# Patient Record
Sex: Male | Born: 1975 | Race: White | Hispanic: No | Marital: Married | Smoking: Never smoker
Health system: Southern US, Community
[De-identification: ages and names within clinical notes are randomized; demographics above are authoritative.]

## PROBLEM LIST (undated history)

## (undated) DIAGNOSIS — N200 Calculus of kidney: Secondary | ICD-10-CM

## (undated) DIAGNOSIS — K760 Fatty (change of) liver, not elsewhere classified: Secondary | ICD-10-CM

## (undated) HISTORY — PX: VARICOSE VEIN SURGERY: SHX832

## (undated) HISTORY — PX: CHOLECYSTECTOMY: SHX55

## (undated) HISTORY — PX: WISDOM TOOTH EXTRACTION: SHX21

## (undated) HISTORY — PX: OTHER SURGICAL HISTORY: SHX169

---

## 2004-04-30 ENCOUNTER — Ambulatory Visit (HOSPITAL_BASED_OUTPATIENT_CLINIC_OR_DEPARTMENT_OTHER): Admission: RE | Admit: 2004-04-30 | Discharge: 2004-04-30 | Payer: Self-pay | Attending: Urology | Admitting: Urology

## 2017-09-27 ENCOUNTER — Other Ambulatory Visit: Payer: Self-pay | Admitting: Urology

## 2017-09-27 ENCOUNTER — Encounter (HOSPITAL_COMMUNITY): Payer: Self-pay

## 2017-09-27 ENCOUNTER — Observation Stay (HOSPITAL_COMMUNITY)
Admission: AD | Admit: 2017-09-27 | Discharge: 2017-09-28 | Disposition: A | Discharge status: Home / Self Care | Payer: PRIVATE HEALTH INSURANCE | Source: Ambulatory Visit | Attending: Urology | Admitting: Urology

## 2017-09-27 ENCOUNTER — Other Ambulatory Visit: Payer: Self-pay

## 2017-09-27 ENCOUNTER — Encounter (HOSPITAL_COMMUNITY)
Admission: AD | Disposition: A | Discharge status: Home / Self Care | Payer: Self-pay | Source: Ambulatory Visit | Attending: Urology

## 2017-09-27 ENCOUNTER — Observation Stay (HOSPITAL_COMMUNITY): Payer: PRIVATE HEALTH INSURANCE

## 2017-09-27 ENCOUNTER — Observation Stay (HOSPITAL_COMMUNITY): Payer: PRIVATE HEALTH INSURANCE | Admitting: Certified Registered Nurse Anesthetist

## 2017-09-27 DIAGNOSIS — Z538 Procedure and treatment not carried out for other reasons: Secondary | ICD-10-CM | POA: Diagnosis not present

## 2017-09-27 DIAGNOSIS — N132 Hydronephrosis with renal and ureteral calculous obstruction: Secondary | ICD-10-CM | POA: Diagnosis not present

## 2017-09-27 DIAGNOSIS — N201 Calculus of ureter: Secondary | ICD-10-CM | POA: Diagnosis present

## 2017-09-27 HISTORY — PX: CYSTOSCOPY/RETROGRADE/URETEROSCOPY: SHX5316

## 2017-09-27 LAB — CBC WITH DIFFERENTIAL/PLATELET
BASOS PCT: 0 %
Basophils Absolute: 0 10*3/uL (ref 0.0–0.1)
EOS ABS: 0 10*3/uL (ref 0.0–0.7)
Eosinophils Relative: 0 %
HCT: 37 % — ABNORMAL LOW (ref 39.0–52.0)
Hemoglobin: 12.3 g/dL — ABNORMAL LOW (ref 13.0–17.0)
Lymphocytes Relative: 17 %
Lymphs Abs: 1.2 10*3/uL (ref 0.7–4.0)
MCH: 30.4 pg (ref 26.0–34.0)
MCHC: 33.2 g/dL (ref 30.0–36.0)
MCV: 91.6 fL (ref 78.0–100.0)
Monocytes Absolute: 0.8 10*3/uL (ref 0.1–1.0)
Monocytes Relative: 11 %
NEUTROS PCT: 72 %
Neutro Abs: 5.2 10*3/uL (ref 1.7–7.7)
PLATELETS: 254 10*3/uL (ref 150–400)
RBC: 4.04 MIL/uL — AB (ref 4.22–5.81)
RDW: 13.3 % (ref 11.5–15.5)
WBC: 7.3 10*3/uL (ref 4.0–10.5)

## 2017-09-27 LAB — BASIC METABOLIC PANEL
Anion gap: 10 (ref 5–15)
BUN: 21 mg/dL — AB (ref 6–20)
CALCIUM: 8.8 mg/dL — AB (ref 8.9–10.3)
CO2: 28 mmol/L (ref 22–32)
CREATININE: 1.86 mg/dL — AB (ref 0.61–1.24)
Chloride: 105 mmol/L (ref 98–111)
GFR, EST AFRICAN AMERICAN: 51 mL/min — AB (ref >60)
GFR, EST NON AFRICAN AMERICAN: 44 mL/min — AB (ref >60)
Glucose, Bld: 115 mg/dL — ABNORMAL HIGH (ref 70–99)
Potassium: 3.9 mmol/L (ref 3.5–5.1)
SODIUM: 143 mmol/L (ref 135–145)

## 2017-09-27 SURGERY — CYSTOSCOPY/RETROGRADE/URETEROSCOPY
Anesthesia: General | Laterality: Left

## 2017-09-27 MED ORDER — CIPROFLOXACIN IN D5W 400 MG/200ML IV SOLN
INTRAVENOUS | Status: AC
  Filled 2017-09-27: qty 200

## 2017-09-27 MED ORDER — ENOXAPARIN SODIUM 40 MG/0.4ML ~~LOC~~ SOLN: 40.0000 mg | Freq: Every day | SUBCUTANEOUS | Status: DC

## 2017-09-27 MED ORDER — PIPERACILLIN-TAZOBACTAM 3.375 G IVPB
3.3750 g | Freq: Three times a day (TID) | INTRAVENOUS | Status: DC
  Administered 2017-09-27 – 2017-09-28 (×2): 3.375 g via INTRAVENOUS
  Filled 2017-09-27 (×2): qty 50

## 2017-09-27 MED ORDER — DEXAMETHASONE SODIUM PHOSPHATE 10 MG/ML IJ SOLN
INTRAMUSCULAR | Status: AC
  Filled 2017-09-27: qty 1

## 2017-09-27 MED ORDER — HYDROMORPHONE HCL 1 MG/ML IJ SOLN: 0.5000 mg | INTRAMUSCULAR | Status: DC | PRN

## 2017-09-27 MED ORDER — CIPROFLOXACIN IN D5W 400 MG/200ML IV SOLN
400.0000 mg | Freq: Two times a day (BID) | INTRAVENOUS | Status: DC
  Administered 2017-09-27: 400 mg via INTRAVENOUS

## 2017-09-27 MED ORDER — SODIUM CHLORIDE 0.9 % IR SOLN
Status: DC | PRN
  Administered 2017-09-27: 3000 mL

## 2017-09-27 MED ORDER — MIDAZOLAM HCL 5 MG/5ML IJ SOLN
INTRAMUSCULAR | Status: DC | PRN
  Administered 2017-09-27: 2 mg via INTRAVENOUS

## 2017-09-27 MED ORDER — OXYCODONE-ACETAMINOPHEN 5-325 MG PO TABS
1.0000 | ORAL_TABLET | Freq: Four times a day (QID) | ORAL | Status: DC | PRN
  Administered 2017-09-27 – 2017-09-28 (×2): 1 via ORAL
  Filled 2017-09-27 (×2): qty 1

## 2017-09-27 MED ORDER — OXYBUTYNIN CHLORIDE 5 MG PO TABS
5.0000 mg | ORAL_TABLET | Freq: Three times a day (TID) | ORAL | Status: DC | PRN
Start: 2017-09-27 — End: 2017-09-28
  Administered 2017-09-27 – 2017-09-28 (×2): 5 mg via ORAL
  Filled 2017-09-27 (×2): qty 1

## 2017-09-27 MED ORDER — OXYCODONE HCL 5 MG PO TABS: 5.0000 mg | ORAL_TABLET | ORAL | Status: DC | PRN

## 2017-09-27 MED ORDER — MIDAZOLAM HCL 2 MG/2ML IJ SOLN
INTRAMUSCULAR | Status: AC
  Filled 2017-09-27: qty 2

## 2017-09-27 MED ORDER — DEXAMETHASONE SODIUM PHOSPHATE 10 MG/ML IJ SOLN
INTRAMUSCULAR | Status: DC | PRN
  Administered 2017-09-27: 10 mg via INTRAVENOUS

## 2017-09-27 MED ORDER — DEXTROSE-NACL 5-0.45 % IV SOLN
INTRAVENOUS | Status: DC
  Administered 2017-09-27: 11:00:00 via INTRAVENOUS

## 2017-09-27 MED ORDER — OXYCODONE HCL 5 MG/5ML PO SOLN
5.0000 mg | Freq: Once | ORAL | Status: AC | PRN
  Filled 2017-09-27: qty 5

## 2017-09-27 MED ORDER — HYDROMORPHONE HCL 1 MG/ML IJ SOLN
INTRAMUSCULAR | Status: AC
  Administered 2017-09-27: 0.5 mg via INTRAVENOUS
  Filled 2017-09-27: qty 1

## 2017-09-27 MED ORDER — ONDANSETRON HCL 4 MG/2ML IJ SOLN
INTRAMUSCULAR | Status: DC | PRN
  Administered 2017-09-27: 4 mg via INTRAVENOUS

## 2017-09-27 MED ORDER — HYDROMORPHONE HCL 1 MG/ML IJ SOLN
0.2500 mg | INTRAMUSCULAR | Status: DC | PRN
  Administered 2017-09-27 (×4): 0.5 mg via INTRAVENOUS

## 2017-09-27 MED ORDER — ACETAMINOPHEN 325 MG PO TABS
650.0000 mg | ORAL_TABLET | ORAL | Status: DC | PRN
  Administered 2017-09-27: 650 mg via ORAL
  Filled 2017-09-27: qty 2

## 2017-09-27 MED ORDER — LIDOCAINE 2% (20 MG/ML) 5 ML SYRINGE
INTRAMUSCULAR | Status: DC | PRN
  Administered 2017-09-27: 100 mg via INTRAVENOUS

## 2017-09-27 MED ORDER — LACTATED RINGERS IV SOLN
INTRAVENOUS | Status: DC
  Administered 2017-09-27: 16:00:00 via INTRAVENOUS

## 2017-09-27 MED ORDER — IOHEXOL 300 MG/ML  SOLN
INTRAMUSCULAR | Status: DC | PRN
  Administered 2017-09-27: 6 mL via URETHRAL

## 2017-09-27 MED ORDER — FENTANYL CITRATE (PF) 100 MCG/2ML IJ SOLN
INTRAMUSCULAR | Status: DC | PRN
  Administered 2017-09-27: 25 ug via INTRAVENOUS
  Administered 2017-09-27: 50 ug via INTRAVENOUS
  Administered 2017-09-27: 25 ug via INTRAVENOUS

## 2017-09-27 MED ORDER — FENTANYL CITRATE (PF) 100 MCG/2ML IJ SOLN
INTRAMUSCULAR | Status: AC
  Filled 2017-09-27: qty 2

## 2017-09-27 MED ORDER — ONDANSETRON HCL 4 MG/2ML IJ SOLN: 4.0000 mg | INTRAMUSCULAR | Status: DC | PRN

## 2017-09-27 MED ORDER — OXYCODONE HCL 5 MG PO TABS
ORAL_TABLET | ORAL | Status: AC
  Administered 2017-09-27: 5 mg via ORAL
  Filled 2017-09-27: qty 1

## 2017-09-27 MED ORDER — ONDANSETRON HCL 4 MG/2ML IJ SOLN
INTRAMUSCULAR | Status: AC
  Filled 2017-09-27: qty 2

## 2017-09-27 MED ORDER — PROPOFOL 10 MG/ML IV BOLUS
INTRAVENOUS | Status: DC | PRN
  Administered 2017-09-27: 200 mg via INTRAVENOUS

## 2017-09-27 MED ORDER — OXYCODONE HCL 5 MG PO TABS
5.0000 mg | ORAL_TABLET | Freq: Once | ORAL | Status: AC | PRN
  Administered 2017-09-27: 5 mg via ORAL

## 2017-09-27 MED ORDER — PROPOFOL 10 MG/ML IV BOLUS
INTRAVENOUS | Status: AC
  Filled 2017-09-27: qty 40

## 2017-09-27 MED ORDER — PROMETHAZINE HCL 25 MG/ML IJ SOLN: 6.2500 mg | INTRAMUSCULAR | Status: DC | PRN

## 2017-09-27 SURGICAL SUPPLY — 26 items
BAG URO CATCHER STRL LF (MISCELLANEOUS) ×3 IMPLANT
BASKET LASER NITINOL 1.9FR (BASKET) ×1 IMPLANT
BASKET ZERO TIP NITINOL 2.4FR (BASKET) ×2 IMPLANT
BSKT STON RTRVL 120 1.9FR (BASKET)
BSKT STON RTRVL ZERO TP 2.4FR (BASKET) ×1
CATH INTERMIT  6FR 70CM (CATHETERS) ×2 IMPLANT
CLOTH BEACON ORANGE TIMEOUT ST (SAFETY) ×1 IMPLANT
COVER FOOTSWITCH UNIV (MISCELLANEOUS) IMPLANT
COVER SURGICAL LIGHT HANDLE (MISCELLANEOUS) ×1 IMPLANT
FIBER LASER FLEXIVA 365 (UROLOGICAL SUPPLIES) IMPLANT
FIBER LASER TRAC TIP (UROLOGICAL SUPPLIES) IMPLANT
GLOVE BIOGEL M 8.0 STRL (GLOVE) ×7 IMPLANT
GOWN STRL REUS W/ TWL XL LVL3 (GOWN DISPOSABLE) ×1 IMPLANT
GOWN STRL REUS W/TWL LRG LVL3 (GOWN DISPOSABLE) ×6 IMPLANT
GOWN STRL REUS W/TWL XL LVL3 (GOWN DISPOSABLE)
GUIDEWIRE ANG ZIPWIRE 038X150 (WIRE) ×1 IMPLANT
GUIDEWIRE STR DUAL SENSOR (WIRE) ×3 IMPLANT
IV NS 1000ML (IV SOLUTION)
IV NS 1000ML BAXH (IV SOLUTION) ×1 IMPLANT
MANIFOLD NEPTUNE II (INSTRUMENTS) ×3 IMPLANT
PACK CYSTO (CUSTOM PROCEDURE TRAY) ×3 IMPLANT
SHEATH URETERAL 12FRX28CM (UROLOGICAL SUPPLIES) ×2 IMPLANT
STENT CONTOUR 6FRX26X.038 (STENTS) ×2 IMPLANT
TUBING CONNECTING 10 (TUBING) ×2 IMPLANT
TUBING CONNECTING 10' (TUBING) ×1
TUBING UROLOGY SET (TUBING) ×1 IMPLANT

## 2017-09-27 NOTE — Anesthesia Postprocedure Evaluation (Signed)
Anesthesia Post Note  Patient: Leanord AsalMatthew Pearse  Procedure(s) Performed: CYSTOSCOPY/LEFT RETROGRADE/ URETEROSCOPY/ DOUBLE J STENT (Left )     Patient location during evaluation: PACU Anesthesia Type: General Level of consciousness: awake and alert Pain management: pain level controlled Vital Signs Assessment: post-procedure vital signs reviewed and stable Respiratory status: spontaneous breathing, nonlabored ventilation, respiratory function stable and patient connected to nasal cannula oxygen Cardiovascular status: blood pressure returned to baseline and stable Postop Assessment: no apparent nausea or vomiting Anesthetic complications: no    Last Vitals:  Vitals:   09/27/17 1815 09/27/17 1836  BP: 120/89 120/86  Pulse: 78 70  Resp: 12 14  Temp: 36.5 C   SpO2: 99% 100%    Last Pain:  Vitals:   09/27/17 1800  TempSrc:   PainSc: 5                  Ryan P Ellender

## 2017-09-27 NOTE — Anesthesia Preprocedure Evaluation (Addendum)
Anesthesia Evaluation  Patient identified by MRN, date of birth, ID band Patient awake    Reviewed: Allergy & Precautions, NPO status , Patient's Chart, lab work & pertinent test results  Airway Mallampati: III  TM Distance: >3 FB Neck ROM: Full    Dental no notable dental hx.    Pulmonary neg pulmonary ROS,    Pulmonary exam normal breath sounds clear to auscultation       Cardiovascular negative cardio ROS Normal cardiovascular exam Rhythm:Regular Rate:Normal     Neuro/Psych negative neurological ROS  negative psych ROS   GI/Hepatic negative GI ROS, Neg liver ROS,   Endo/Other  negative endocrine ROS  Renal/GU Renal disease     Musculoskeletal negative musculoskeletal ROS (+)   Abdominal   Peds  Hematology  (+) anemia ,   Anesthesia Other Findings left ureteral stone fever  Reproductive/Obstetrics                            Anesthesia Physical Anesthesia Plan  ASA: I  Anesthesia Plan: General   Post-op Pain Management:    Induction: Intravenous  PONV Risk Score and Plan: 2 and Ondansetron, Dexamethasone, Midazolam and Treatment may vary due to age or medical condition  Airway Management Planned: LMA  Additional Equipment:   Intra-op Plan:   Post-operative Plan: Extubation in OR  Informed Consent: I have reviewed the patients History and Physical, chart, labs and discussed the procedure including the risks, benefits and alternatives for the proposed anesthesia with the patient or authorized representative who has indicated his/her understanding and acceptance.   Dental advisory given  Plan Discussed with: CRNA  Anesthesia Plan Comments:         Anesthesia Quick Evaluation

## 2017-09-27 NOTE — Anesthesia Procedure Notes (Signed)
Procedure Name: LMA Insertion Date/Time: 09/27/2017 4:34 PM Performed by: Wynonia SoursWalker, Kyleeann Cremeans L, CRNA Pre-anesthesia Checklist: Patient identified, Emergency Drugs available, Suction available, Patient being monitored and Timeout performed Patient Re-evaluated:Patient Re-evaluated prior to induction Oxygen Delivery Method: Circle system utilized Preoxygenation: Pre-oxygenation with 100% oxygen Induction Type: IV induction LMA: LMA with gastric port inserted LMA Size: 5.0 Number of attempts: 1 Placement Confirmation: positive ETCO2 and breath sounds checked- equal and bilateral Tube secured with: Tape Dental Injury: Teeth and Oropharynx as per pre-operative assessment

## 2017-09-27 NOTE — H&P (Signed)
H&P  Chief Complaint: kidney stone  History of Present Illness: 42 year old male presenting with left ureteral stone and associated fever 102.9.  CT in the office revealed a left distal stone at the UVJ. The patient's urine did not look significantly infected, but with fever, it was recommended that he undergo urgent cystoscopy, retrograde and stent placement.  I discussed the above mentioned procedure with the patient as well as risks and complications.  He desires to proceed.  History reviewed. No pertinent past medical history.  Past Surgical History:  Procedure Laterality Date  . Bilateral knee arthroscopies    . CHOLECYSTECTOMY    . VARICOSE VEIN SURGERY    . WISDOM TOOTH EXTRACTION      Home Medications:    Allergies: No Known Allergies  History reviewed. No pertinent family history.  Social History:  reports that he has never smoked. He has never used smokeless tobacco. He reports that he drank alcohol. He reports that he does not use drugs.  ROS: A complete review of systems was performed.  All systems are negative except for pertinent findings as noted.  Physical Exam:  Vital signs in last 24 hours: Temp:  [99.9 F (37.7 C)] 99.9 F (37.7 C) (07/24 1106) Pulse Rate:  [98] 98 (07/24 1106) Resp:  [20] 20 (07/24 1106) BP: (124)/(79) 124/79 (07/24 1106) SpO2:  [94 %] 94 % (07/24 1106) Weight:  [102.9 kg (226 lb 13.7 oz)] 102.9 kg (226 lb 13.7 oz) (07/24 1129) Constitutional:  Alert and oriented, No acute distress Cardiovascular: Regular rate  Respiratory: Normal respiratory effort GI: Abdomen is soft, nontender, nondistended, no abdominal masses.  Left lower quadrant tenderness. Lymphatic: No lymphadenopathy Neurologic: Grossly intact, no focal deficits Psychiatric: Normal mood and affect  Laboratory Data:  Recent Labs    09/27/17 1241  WBC 7.3  HGB 12.3*  HCT 37.0*  PLT 254    Recent Labs    09/27/17 1241  NA 143  K 3.9  CL 105  GLUCOSE 115*  BUN  21*  CALCIUM 8.8*  CREATININE 1.86*     Results for orders placed or performed during the hospital encounter of 09/27/17 (from the past 24 hour(s))  Basic metabolic panel     Status: Abnormal   Collection Time: 09/27/17 12:41 PM  Result Value Ref Range   Sodium 143 135 - 145 mmol/L   Potassium 3.9 3.5 - 5.1 mmol/L   Chloride 105 98 - 111 mmol/L   CO2 28 22 - 32 mmol/L   Glucose, Bld 115 (H) 70 - 99 mg/dL   BUN 21 (H) 6 - 20 mg/dL   Creatinine, Ser 1.61 (H) 0.61 - 1.24 mg/dL   Calcium 8.8 (L) 8.9 - 10.3 mg/dL   GFR calc non Af Amer 44 (L) >60 mL/min   GFR calc Af Amer 51 (L) >60 mL/min   Anion gap 10 5 - 15  CBC WITH DIFFERENTIAL     Status: Abnormal   Collection Time: 09/27/17 12:41 PM  Result Value Ref Range   WBC 7.3 4.0 - 10.5 K/uL   RBC 4.04 (L) 4.22 - 5.81 MIL/uL   Hemoglobin 12.3 (L) 13.0 - 17.0 g/dL   HCT 09.6 (L) 04.5 - 40.9 %   MCV 91.6 78.0 - 100.0 fL   MCH 30.4 26.0 - 34.0 pg   MCHC 33.2 30.0 - 36.0 g/dL   RDW 81.1 91.4 - 78.2 %   Platelets 254 150 - 400 K/uL   Neutrophils Relative % 72 %  Neutro Abs 5.2 1.7 - 7.7 K/uL   Lymphocytes Relative 17 %   Lymphs Abs 1.2 0.7 - 4.0 K/uL   Monocytes Relative 11 %   Monocytes Absolute 0.8 0.1 - 1.0 K/uL   Eosinophils Relative 0 %   Eosinophils Absolute 0.0 0.0 - 0.7 K/uL   Basophils Relative 0 %   Basophils Absolute 0.0 0.0 - 0.1 K/uL   Smear Review MORPHOLOGY UNREMARKABLE    No results found for this or any previous visit (from the past 240 hour(s)).  Renal Function: Recent Labs    09/27/17 1241  CREATININE 1.86*   Estimated Creatinine Clearance: 66.5 mL/min (A) (by C-G formula based on SCr of 1.86 mg/dL (H)).  Radiologic Imaging: No results found.  Impression/Assessment:  Probable infected left distal ureteral stone  Plan:  1.  Admit for hydration, IV antibiotics  2.  Urgent cystoscopy, left retrograde, double-J stent placement

## 2017-09-27 NOTE — Transfer of Care (Signed)
Immediate Anesthesia Transfer of Care Note  Patient: Eric Lutz  Procedure(s) Performed: CYSTOSCOPY/LEFT RETROGRADE/ URETEROSCOPY/ DOUBLE J STENT (Left )  Patient Location: PACU  Anesthesia Type:General  Level of Consciousness: sedated  Airway & Oxygen Therapy: Patient Spontanous Breathing and Patient connected to face mask oxygen  Post-op Assessment: Report given to RN and Post -op Vital signs reviewed and stable  Post vital signs: Reviewed and stable  Last Vitals:  Vitals Value Taken Time  BP 113/77 09/27/2017  5:12 PM  Temp    Pulse 78 09/27/2017  5:15 PM  Resp 11 09/27/2017  5:15 PM  SpO2 100 % 09/27/2017  5:15 PM  Vitals shown include unvalidated device data.  Last Pain:  Vitals:   09/27/17 1106  TempSrc: Oral  PainSc:       Patients Stated Pain Goal: 4 (09/27/17 1106)  Complications: No apparent anesthesia complications

## 2017-09-27 NOTE — Progress Notes (Signed)
Pharmacy Antibiotic Note  Leanord AsalMatthew Lutz is a 42 y.o. male admitted on 09/27/2017 with UTI.  Pharmacy has been consulted for Zosyn dosing.  Plan: Zosyn 3.375g IV q8h (4 hour infusion).  Monitor clinical course, renal function, cultures as available   Dosage will likely remain stable at above dose and need for further dosage adjustment appears unlikely at present.    Will sign off at this time.  Please reconsult if a change in clinical status warrants re-evaluation of dosage.     Height: 6\' 1"  (185.4 cm) Weight: 226 lb 13.7 oz (102.9 kg) IBW/kg (Calculated) : 79.9  Temp (24hrs), Avg:98.5 F (36.9 C), Min:97.7 F (36.5 C), Max:99.9 F (37.7 C)  Recent Labs  Lab 09/27/17 1241  WBC 7.3  CREATININE 1.86*    Estimated Creatinine Clearance: 66.5 mL/min (A) (by C-G formula based on SCr of 1.86 mg/dL (H)).    No Known Allergies  Antimicrobials this admission:  7/24 Cipro x1   7/24 Zosyn >>     Thank you for allowing pharmacy to be a part of this patient's care.   Adalberto ColeNikola Merilyn Pagan, PharmD, BCPS Pager [phone] 09/27/2017 7:05 PM

## 2017-09-27 NOTE — Op Note (Signed)
Preoperative diagnosis: Left distal ureteral stone with possible UTI  Postoperative diagnosis: Same  Principal procedure: Cystoscopy, left retrograde ureteropyelogram, fluoroscopic interpretation, dilation of left distal ureter, attempted basketing of left distal ureteral stone, left ureteroscopy, placement of 6 French by 26 cm contour double-J stent with tether  Surgeon: Iziah Cates  Anesthesia: General with LMA  Complications: None  Specimen: Stone fragment  Estimated blood loss: None  Indications: 42 year old male presenting today with fever was 102.9 as well as left distal ureteral stone.  Although the urine did not look infected, it was recommended that he undergo hospitalization, hydration and urgent stent placement.  The patient consents to this procedure.  It has been discussed with both he and his wife.  They understand the process and desire to proceed.  Findings: Urethra was normal, prostate nonobstructive.  Bladder revealed normal urothelium, no stones, no abnormalities of the orifices.  Retrograde ureteropyelogram revealed a filling defect in the left distal ureter consistent with stone with proximal mild hydroureteronephrosis.  Description of procedure: The patient was properly marked and identified in the holding area.  He is taken to the operating room where general anesthetic was administered with the LMA.  He was placed in the dorsolithotomy position.  Genitalia and perineum were prepped and draped.  Proper timeout was performed.  A 22 French panendoscope was advanced under direct vision through his urethra and into his bladder.  Inspection was carried out with the above-mentioned findings.  Using a 6 [place]French open-ended catheter retrograde ureteropyelogram was performed with Omnipaque.  This revealed a filling defect in the left distal ureter without other significant abnormalities.  Thinking that if this was a distal ureteral stone, and despite possibly having infected urine, I  did want to give a try at basketing the stone blindly.  I dilated the ureteral orifice under fluoroscopic guidance first with the obturator within the entire 12/14 ureteral access catheter, over the guidewire which was left in place following the retrograde.  Following this, I passed a 0 tip nitinol basket up the ureter.  2 separate passes failed to reveal any stone material.  There were several small stone fragments in the bladder at this point, and I was thinking that perhaps the stone was fragmented by dilation.  I removed the cystoscope in the basket, and gently passed a 6 French semirigid ureteroscope into the ureter without any stone seen.   irrigation pressure through the ureteroscope was kept at a minimum.  At this point, the ureteroscope was removed.  I then replaced the cystoscope and did find one fairly large fragment within the bladder.  I then, over the top of the guidewire, placed a 6 [place]French by 26 cm contour double-J stent using fluoroscopic and cystoscopic guidance.  It was deployed adequately with good proximal and distal curl seen.  The tether was left on.  The bladder was drained, the scope removed, and the procedure terminated.  The tether was taped to the patient's penis.  He was then awakened and taken to the PACU in stable  condition, having tolerated the procedure well.

## 2017-09-27 NOTE — Progress Notes (Signed)
Patient c/o moderate pain. Will notify PCP on call

## 2017-09-28 ENCOUNTER — Encounter (HOSPITAL_COMMUNITY): Payer: Self-pay | Admitting: Urology

## 2017-09-28 DIAGNOSIS — N132 Hydronephrosis with renal and ureteral calculous obstruction: Secondary | ICD-10-CM | POA: Diagnosis not present

## 2017-09-28 LAB — HIV ANTIBODY (ROUTINE TESTING W REFLEX): HIV Screen 4th Generation wRfx: NONREACTIVE

## 2017-09-28 MED ORDER — SULFAMETHOXAZOLE-TRIMETHOPRIM 800-160 MG PO TABS: 1.0000 | ORAL_TABLET | Freq: Two times a day (BID) | ORAL | 0 refills | Status: DC

## 2017-09-28 NOTE — Discharge Instructions (Signed)
1. You may see some blood in the urine and may have some burning with urination for 48-72 hours. You also may notice that you have to urinate more frequently or urgently after your procedure which is normal.  2. You should call should you develop an inability urinate, fever > 101, persistent nausea and vomiting that prevents you from eating or drinking to stay hydrated.  3. If you have a stent, you will likely urinate more frequently and urgently until the stent is removed and you may experience some discomfort/pain in the lower abdomen and flank especially when urinating. You may take pain medication prescribed to you if needed for pain. You may also intermittently have blood in the urine until the stent is removed. OK to remove stent Monday am

## 2017-09-28 NOTE — Discharge Summary (Signed)
Patient ID: Eric Lutz MRN: [id number] DOB/AGE: 41/10/1975 41 y.o.  Admit date: 09/27/2017 Discharge date: 09/28/2017  Primary Care Physician:  Paulina FusiSchultz, Douglas E, MD  Discharge Diagnoses:  Left ureteral stone Present on Admission: . Calculus of ureter   Consults:  None   Discharge Medications: Allergies as of 09/28/2017   No Known Allergies     Medication List    TAKE these medications   acetaminophen 500 MG tablet Commonly known as:  TYLENOL Take 1,000 mg by mouth every 6 (six) hours as needed for moderate pain or fever.   APPLE CIDER VINEGAR PO Take 1 tablet by mouth daily.   ibuprofen 200 MG tablet Commonly known as:  ADVIL,MOTRIN Take 600 mg by mouth every 6 (six) hours as needed for fever or moderate pain.   OVER THE COUNTER MEDICATION Take 1-2 tablets by mouth daily.   oxyCODONE 5 MG immediate release tablet Commonly known as:  Oxy IR/ROXICODONE TAKE 1 TO 3 TABLETS BY MOUTH EVERY 4 HOURS AS NEEDED FOR PAIN   promethazine 25 MG tablet Commonly known as:  PHENERGAN TAKE ONE TABLET BY MOUTH EVERY 6 HOURS AS NEEDED FOR NAUSEA AND VOMITING   sulfamethoxazole-trimethoprim 800-160 MG tablet Commonly known as:  BACTRIM DS,SEPTRA DS Take 1 tablet by mouth 2 (two) times daily.   SUPER ENZYMES PO Take 1 tablet by mouth daily.        Significant Diagnostic Studies:  Dg C-arm 1-60 Min-no Report  Result Date: 09/27/2017 Fluoroscopy was utilized by the requesting physician.  No radiographic interpretation.    Brief H and P: For complete details please refer to admission H and P, but in brief patient was admitted for mgmt of a left disstal ureteral stone and possible UTI (he had fever).  Hospital Course: Larina BrasStone was removed during procedure. HE remained afebrile postop and condition improved. He was d/ced on POD 1 Active Problems:   Calculus of ureter   Day of Discharge BP 113/75 (BP Location: Right Arm)   Pulse 68   Temp 97.8 F (36.6 C) (Oral)   Resp  14   Ht 6\' 1"  (1.854 m)   Wt 102.9 kg (226 lb 13.7 oz)   SpO2 99%   BMI 29.93 kg/m   Results for orders placed or performed during the hospital encounter of 09/27/17 (from the past 24 hour(s))  HIV antibody (Routine Testing)     Status: None   Collection Time: 09/27/17 12:41 PM  Result Value Ref Range   HIV Screen 4th Generation wRfx Non Reactive Non Reactive  Basic metabolic panel     Status: Abnormal   Collection Time: 09/27/17 12:41 PM  Result Value Ref Range   Sodium 143 135 - 145 mmol/L   Potassium 3.9 3.5 - 5.1 mmol/L   Chloride 105 98 - 111 mmol/L   CO2 28 22 - 32 mmol/L   Glucose, Bld 115 (H) 70 - 99 mg/dL   BUN 21 (H) 6 - 20 mg/dL   Creatinine, Ser 4.091.86 (H) 0.61 - 1.24 mg/dL   Calcium 8.8 (L) 8.9 - 10.3 mg/dL   GFR calc non Af Amer 44 (L) >60 mL/min   GFR calc Af Amer 51 (L) >60 mL/min   Anion gap 10 5 - 15  CBC WITH DIFFERENTIAL     Status: Abnormal   Collection Time: 09/27/17 12:41 PM  Result Value Ref Range   WBC 7.3 4.0 - 10.5 K/uL   RBC 4.04 (L) 4.22 - 5.81 MIL/uL   Hemoglobin 12.3 (  L) 13.0 - 17.0 g/dL   HCT 40.9 (L) 81.1 - 91.4 %   MCV 91.6 78.0 - 100.0 fL   MCH 30.4 26.0 - 34.0 pg   MCHC 33.2 30.0 - 36.0 g/dL   RDW 78.2 95.6 - 21.3 %   Platelets 254 150 - 400 K/uL   Neutrophils Relative % 72 %   Neutro Abs 5.2 1.7 - 7.7 K/uL   Lymphocytes Relative 17 %   Lymphs Abs 1.2 0.7 - 4.0 K/uL   Monocytes Relative 11 %   Monocytes Absolute 0.8 0.1 - 1.0 K/uL   Eosinophils Relative 0 %   Eosinophils Absolute 0.0 0.0 - 0.7 K/uL   Basophils Relative 0 %   Basophils Absolute 0.0 0.0 - 0.1 K/uL   Smear Review MORPHOLOGY UNREMARKABLE     Physical Exam: General: Alert and awake oriented x3 not in any acute distress. HEENT: anicteric sclera, pupils reactive to light and accommodation CVS: S1-S2 clear no murmur rubs or gallops Chest: clear to auscultation bilaterally, no wheezing rales or rhonchi Abdomen: soft nontender, nondistended, normal bowel sounds, no  organomegaly Extremities: no cyanosis, clubbing or edema noted bilaterally Neuro: Cranial nerves II-XII intact, no focal neurological deficits  Disposition:  Home  Diet:  Regular  Activity:  No restrictions   Disposition and Follow-up:    Will arrange f/u  TESTS THAT NEED FOLLOW-UP  N/A  DISCHARGE FOLLOW-UP Follow-up Information    Marcine Matar, MD.   Specialty:  Urology Why:  We will call to set up appointment Contact information: [address] [place] [address] [phone]           Time spent on Discharge:  10 mins  Signed: Bertram Millard Saray Capasso 09/28/2017, 8:47 AM

## 2020-01-27 ENCOUNTER — Inpatient Hospital Stay (HOSPITAL_COMMUNITY): Payer: PRIVATE HEALTH INSURANCE

## 2020-01-27 ENCOUNTER — Encounter (HOSPITAL_COMMUNITY): Payer: Self-pay | Admitting: Internal Medicine

## 2020-01-27 ENCOUNTER — Other Ambulatory Visit: Payer: Self-pay

## 2020-01-27 ENCOUNTER — Inpatient Hospital Stay (HOSPITAL_COMMUNITY)
Admission: EM | Admit: 2020-01-27 | Discharge: 2020-01-31 | DRG: 871 | Disposition: A | Discharge status: Home / Self Care | Payer: PRIVATE HEALTH INSURANCE | Attending: Internal Medicine | Admitting: Internal Medicine

## 2020-01-27 ENCOUNTER — Emergency Department (HOSPITAL_COMMUNITY): Payer: PRIVATE HEALTH INSURANCE

## 2020-01-27 DIAGNOSIS — A419 Sepsis, unspecified organism: Secondary | ICD-10-CM | POA: Diagnosis present

## 2020-01-27 DIAGNOSIS — R0781 Pleurodynia: Secondary | ICD-10-CM

## 2020-01-27 DIAGNOSIS — E669 Obesity, unspecified: Secondary | ICD-10-CM | POA: Diagnosis present

## 2020-01-27 DIAGNOSIS — E46 Unspecified protein-calorie malnutrition: Secondary | ICD-10-CM | POA: Diagnosis present

## 2020-01-27 DIAGNOSIS — Z789 Other specified health status: Secondary | ICD-10-CM | POA: Diagnosis not present

## 2020-01-27 DIAGNOSIS — A4189 Other specified sepsis: Secondary | ICD-10-CM | POA: Diagnosis present

## 2020-01-27 DIAGNOSIS — J9601 Acute respiratory failure with hypoxia: Secondary | ICD-10-CM | POA: Diagnosis present

## 2020-01-27 DIAGNOSIS — Z66 Do not resuscitate: Secondary | ICD-10-CM | POA: Diagnosis present

## 2020-01-27 DIAGNOSIS — R6520 Severe sepsis without septic shock: Secondary | ICD-10-CM

## 2020-01-27 DIAGNOSIS — Z79899 Other long term (current) drug therapy: Secondary | ICD-10-CM

## 2020-01-27 DIAGNOSIS — U071 COVID-19: Secondary | ICD-10-CM | POA: Diagnosis present

## 2020-01-27 DIAGNOSIS — Z87442 Personal history of urinary calculi: Secondary | ICD-10-CM | POA: Diagnosis not present

## 2020-01-27 DIAGNOSIS — K760 Fatty (change of) liver, not elsewhere classified: Secondary | ICD-10-CM | POA: Diagnosis present

## 2020-01-27 DIAGNOSIS — R7989 Other specified abnormal findings of blood chemistry: Secondary | ICD-10-CM | POA: Diagnosis present

## 2020-01-27 DIAGNOSIS — Z7951 Long term (current) use of inhaled steroids: Secondary | ICD-10-CM | POA: Diagnosis not present

## 2020-01-27 DIAGNOSIS — J1282 Pneumonia due to coronavirus disease 2019: Secondary | ICD-10-CM | POA: Diagnosis present

## 2020-01-27 DIAGNOSIS — Z6830 Body mass index (BMI) 30.0-30.9, adult: Secondary | ICD-10-CM

## 2020-01-27 DIAGNOSIS — R0603 Acute respiratory distress: Secondary | ICD-10-CM

## 2020-01-27 DIAGNOSIS — Z885 Allergy status to narcotic agent status: Secondary | ICD-10-CM

## 2020-01-27 DIAGNOSIS — E66811 Obesity, class 1: Secondary | ICD-10-CM | POA: Diagnosis present

## 2020-01-27 HISTORY — DX: Fatty (change of) liver, not elsewhere classified: K76.0

## 2020-01-27 HISTORY — DX: Calculus of kidney: N20.0

## 2020-01-27 LAB — CBC WITH DIFFERENTIAL/PLATELET
Abs Immature Granulocytes: 0.11 10*3/uL — ABNORMAL HIGH (ref 0.00–0.07)
Basophils Absolute: 0 10*3/uL (ref 0.0–0.1)
Basophils Relative: 0 %
Eosinophils Absolute: 0 10*3/uL (ref 0.0–0.5)
Eosinophils Relative: 0 %
HCT: 39 % (ref 39.0–52.0)
Hemoglobin: 12.6 g/dL — ABNORMAL LOW (ref 13.0–17.0)
Immature Granulocytes: 2 %
Lymphocytes Relative: 12 %
Lymphs Abs: 0.7 10*3/uL (ref 0.7–4.0)
MCH: 29.7 pg (ref 26.0–34.0)
MCHC: 32.3 g/dL (ref 30.0–36.0)
MCV: 92 fL (ref 80.0–100.0)
Monocytes Absolute: 0.4 10*3/uL (ref 0.1–1.0)
Monocytes Relative: 7 %
Neutro Abs: 4.7 10*3/uL (ref 1.7–7.7)
Neutrophils Relative %: 79 %
Platelets: 282 10*3/uL (ref 150–400)
RBC: 4.24 MIL/uL (ref 4.22–5.81)
RDW: 13.2 % (ref 11.5–15.5)
WBC: 6 10*3/uL (ref 4.0–10.5)
nRBC: 0 % (ref 0.0–0.2)

## 2020-01-27 LAB — FIBRINOGEN: Fibrinogen: 705 mg/dL — ABNORMAL HIGH (ref 210–475)

## 2020-01-27 LAB — D-DIMER, QUANTITATIVE: D-Dimer, Quant: 9.51 ug/mL-FEU — ABNORMAL HIGH (ref 0.00–0.50)

## 2020-01-27 LAB — COMPREHENSIVE METABOLIC PANEL
ALT: 64 U/L — ABNORMAL HIGH (ref 0–44)
AST: 91 U/L — ABNORMAL HIGH (ref 15–41)
Albumin: 2.7 g/dL — ABNORMAL LOW (ref 3.5–5.0)
Alkaline Phosphatase: 105 U/L (ref 38–126)
Anion gap: 10 (ref 5–15)
BUN: 7 mg/dL (ref 6–20)
CO2: 28 mmol/L (ref 22–32)
Calcium: 8.3 mg/dL — ABNORMAL LOW (ref 8.9–10.3)
Chloride: 102 mmol/L (ref 98–111)
Creatinine, Ser: 0.9 mg/dL (ref 0.61–1.24)
GFR, Estimated: >60 mL/min (ref >60)
Glucose, Bld: 126 mg/dL — ABNORMAL HIGH (ref 70–99)
Potassium: 3.9 mmol/L (ref 3.5–5.1)
Sodium: 140 mmol/L (ref 135–145)
Total Bilirubin: 0.9 mg/dL (ref 0.3–1.2)
Total Protein: 5.9 g/dL — ABNORMAL LOW (ref 6.5–8.1)

## 2020-01-27 LAB — LACTIC ACID, PLASMA
Lactic Acid, Venous: 1.8 mmol/L (ref 0.5–1.9)
Lactic Acid, Venous: 1.2 mmol/L (ref 0.5–1.9)

## 2020-01-27 LAB — LACTATE DEHYDROGENASE: LDH: 437 U/L — ABNORMAL HIGH (ref 98–192)

## 2020-01-27 LAB — FERRITIN: Ferritin: 2086 ng/mL — ABNORMAL HIGH (ref 24–336)

## 2020-01-27 LAB — RESPIRATORY PANEL BY RT PCR (FLU A&B, COVID)
Influenza A by PCR: NEGATIVE
Influenza B by PCR: NEGATIVE
SARS Coronavirus 2 by RT PCR: POSITIVE — AB

## 2020-01-27 LAB — PROCALCITONIN: Procalcitonin: 0.45 ng/mL

## 2020-01-27 LAB — TRIGLYCERIDES: Triglycerides: 244 mg/dL — ABNORMAL HIGH (ref <150)

## 2020-01-27 LAB — C-REACTIVE PROTEIN: CRP: 20.8 mg/dL — ABNORMAL HIGH (ref <1.0)

## 2020-01-27 MED ORDER — FAMOTIDINE 20 MG PO TABS
20.0000 mg | ORAL_TABLET | Freq: Two times a day (BID) | ORAL | Status: DC
  Administered 2020-01-27 – 2020-01-31 (×9): 20 mg via ORAL
  Filled 2020-01-27 (×9): qty 1

## 2020-01-27 MED ORDER — IOHEXOL 350 MG/ML SOLN
75.0000 mL | Freq: Once | INTRAVENOUS | Status: AC | PRN
  Administered 2020-01-27: 75 mL via INTRAVENOUS

## 2020-01-27 MED ORDER — SODIUM CHLORIDE 0.9 % IV SOLN
100.0000 mg | Freq: Every day | INTRAVENOUS | Status: AC
  Administered 2020-01-28 – 2020-01-31 (×4): 100 mg via INTRAVENOUS
  Filled 2020-01-27 (×4): qty 20

## 2020-01-27 MED ORDER — ENOXAPARIN SODIUM 60 MG/0.6ML ~~LOC~~ SOLN
50.0000 mg | Freq: Two times a day (BID) | SUBCUTANEOUS | Status: DC
  Administered 2020-01-27 – 2020-01-31 (×8): 50 mg via SUBCUTANEOUS
  Filled 2020-01-27 (×3): qty 0.6
  Filled 2020-01-27: qty 0.5
  Filled 2020-01-27 (×5): qty 0.6

## 2020-01-27 MED ORDER — GUAIFENESIN-DM 100-10 MG/5ML PO SYRP: 10.0000 mL | ORAL_SOLUTION | ORAL | Status: DC | PRN

## 2020-01-27 MED ORDER — METHYLPREDNISOLONE SODIUM SUCC 125 MG IJ SOLR
1.0000 mg/kg | Freq: Two times a day (BID) | INTRAMUSCULAR | Status: DC
  Administered 2020-01-27 – 2020-01-28 (×2): 106.875 mg via INTRAVENOUS
  Filled 2020-01-27 (×2): qty 2

## 2020-01-27 MED ORDER — SODIUM CHLORIDE 0.9 % IV SOLN
200.0000 mg | Freq: Once | INTRAVENOUS | Status: AC
  Administered 2020-01-27: 200 mg via INTRAVENOUS
  Filled 2020-01-27: qty 40

## 2020-01-27 MED ORDER — ZINC SULFATE 220 (50 ZN) MG PO CAPS
220.0000 mg | ORAL_CAPSULE | Freq: Every day | ORAL | Status: DC
  Administered 2020-01-27 – 2020-01-31 (×5): 220 mg via ORAL
  Filled 2020-01-27 (×5): qty 1

## 2020-01-27 MED ORDER — TOCILIZUMAB 400 MG/20ML IV SOLN
800.0000 mg | Freq: Once | INTRAVENOUS | Status: AC
  Administered 2020-01-27: 800 mg via INTRAVENOUS
  Filled 2020-01-27: qty 40

## 2020-01-27 MED ORDER — ACETAMINOPHEN 325 MG PO TABS: 650.0000 mg | ORAL_TABLET | Freq: Four times a day (QID) | ORAL | Status: DC | PRN

## 2020-01-27 MED ORDER — ONDANSETRON HCL 4 MG PO TABS: 4.0000 mg | ORAL_TABLET | Freq: Four times a day (QID) | ORAL | Status: DC | PRN

## 2020-01-27 MED ORDER — ALBUTEROL SULFATE HFA 108 (90 BASE) MCG/ACT IN AERS
2.0000 | INHALATION_SPRAY | Freq: Four times a day (QID) | RESPIRATORY_TRACT | Status: DC
  Administered 2020-01-27 – 2020-01-28 (×6): 2 via RESPIRATORY_TRACT
  Filled 2020-01-27: qty 6.7

## 2020-01-27 MED ORDER — ENOXAPARIN SODIUM 40 MG/0.4ML ~~LOC~~ SOLN: 40.0000 mg | SUBCUTANEOUS | Status: DC

## 2020-01-27 MED ORDER — ONDANSETRON HCL 4 MG/2ML IJ SOLN
4.0000 mg | Freq: Once | INTRAMUSCULAR | Status: AC
  Administered 2020-01-27: 4 mg via INTRAVENOUS
  Filled 2020-01-27: qty 2

## 2020-01-27 MED ORDER — AZITHROMYCIN 500 MG PO TABS
500.0000 mg | ORAL_TABLET | Freq: Every day | ORAL | Status: DC
  Administered 2020-01-27 – 2020-01-30 (×4): 500 mg via ORAL
  Filled 2020-01-27 (×3): qty 1
  Filled 2020-01-27: qty 2

## 2020-01-27 MED ORDER — ONDANSETRON HCL 4 MG/2ML IJ SOLN
4.0000 mg | Freq: Four times a day (QID) | INTRAMUSCULAR | Status: DC | PRN
  Administered 2020-01-27: 4 mg via INTRAVENOUS
  Filled 2020-01-27: qty 2

## 2020-01-27 MED ORDER — SODIUM CHLORIDE 0.9 % IV SOLN
2.0000 g | Freq: Every day | INTRAVENOUS | Status: DC
  Administered 2020-01-27 – 2020-01-30 (×4): 2 g via INTRAVENOUS
  Filled 2020-01-27 (×4): qty 20

## 2020-01-27 MED ORDER — HYDROCOD POLST-CPM POLST ER 10-8 MG/5ML PO SUER: 5.0000 mL | Freq: Two times a day (BID) | ORAL | Status: DC | PRN

## 2020-01-27 MED ORDER — DEXAMETHASONE SODIUM PHOSPHATE 10 MG/ML IJ SOLN
10.0000 mg | Freq: Once | INTRAMUSCULAR | Status: AC
  Administered 2020-01-27: 10 mg via INTRAVENOUS
  Filled 2020-01-27: qty 1

## 2020-01-27 MED ORDER — PREDNISONE 5 MG PO TABS: 50.0000 mg | ORAL_TABLET | Freq: Every day | ORAL | Status: DC

## 2020-01-27 MED ORDER — ACETAMINOPHEN 500 MG PO TABS
1000.0000 mg | ORAL_TABLET | Freq: Once | ORAL | Status: AC
  Administered 2020-01-27: 1000 mg via ORAL
  Filled 2020-01-27: qty 2

## 2020-01-27 MED ORDER — PANTOPRAZOLE SODIUM 40 MG PO TBEC
40.0000 mg | DELAYED_RELEASE_TABLET | Freq: Every day | ORAL | Status: DC
  Administered 2020-01-28 – 2020-01-31 (×4): 40 mg via ORAL
  Filled 2020-01-27 (×4): qty 1

## 2020-01-27 MED ORDER — ENSURE ENLIVE PO LIQD
237.0000 mL | Freq: Two times a day (BID) | ORAL | Status: DC
  Administered 2020-01-27 – 2020-01-31 (×7): 237 mL via ORAL
  Filled 2020-01-27: qty 237

## 2020-01-27 MED ORDER — SODIUM CHLORIDE 0.9% FLUSH
3.0000 mL | Freq: Two times a day (BID) | INTRAVENOUS | Status: DC
  Administered 2020-01-27 – 2020-01-31 (×9): 3 mL via INTRAVENOUS

## 2020-01-27 MED ORDER — ASCORBIC ACID 500 MG PO TABS
500.0000 mg | ORAL_TABLET | Freq: Every day | ORAL | Status: DC
  Administered 2020-01-27 – 2020-01-31 (×5): 500 mg via ORAL
  Filled 2020-01-27 (×5): qty 1

## 2020-01-27 NOTE — H&P (Addendum)
History and Physical    Leanord AsalMatthew Kalama ZOX:[id number] DOB: 01/03/1976 DOA: 01/27/2020  Referring MD/NP/PA:Shalyn Allena KatzPatel, PA-C PCP: Paulina FusiSchultz, Douglas E, MD  Patient coming from: Home via EMS  Chief Complaint: Shortness of breath  I have personally briefly reviewed patient[facility]   HPI: Leanord AsalMatthew Leduc is a 44 y.o. male with medical history significant of COVID-19 diagnosed 12 days ago with complaints of progressively worsening shortness of breath.  Patient notes that his symptoms started with complaints of headache and high fevers up to 102 F at home.  He has been alternating Tylenol and Advil with temporary relief.   Associated symptoms of right-sided lower rib pain secondary to coughing, poor appetite, nausea, and diarrhea. He had a televisit with his primary care provider at that time and had been given ivermectin, Z-Pak, budesonide inhaler, and cough medicine with hydrocodone.  Patient notes that he was taking medications as advised and symptoms were not improving.  His wife has been checking his oxygenation at home and his O2 sats had intermittently been dipping into the 80s for which his primary care doctor was notified that he was started on oxygen at home.  Patient notes that he had not been vaccinated and had been taking care of his father prior to the onset of his symptoms who had Covid.  His wife notes that the prior month they both had had a upper respiratory infection, but he had never gotten over his.  Over the last couple days his oxygenation had been intermittently dipping into the 80s despite being on oxygen.  This morning his wife thinks that his oxygenation dropped into the 50s or 60s and she immediately called EMS.  Patient states that he does not want to be intubated and if he were to pass away that he would not want to be resuscitated and his wife is okay with this.  He denies any history of smoking or alcohol use or need of oxygen prior to being  diagnosed with Covid-19.  In route with EMS patient had been placed on 15 L nonrebreather and transferred to the hospital.  ED Course: Upon admission into the emergency department patient was seen to be febrile up to 101.7 F with pulse 72-1 01, respirations 18-34, and O2 saturations currently maintained on 10 L high flow nasal cannula oxygen.  Labs significant for albumin 2.7, AST 91, ALT 64, LDH 437, triglycerides 244, ferritin 2086, CRP 20.8, lactic acid 1.8->1.2, and procalcitonin 0.45.  Chest x-ray concerning for multifocal pneumonia.  Confirmation COVID-19 screening was positive.  Patient had been given 10 mg of Decadron IV and 1000 mg of Tylenol for the fever.  TRH called to admit.  Review of Systems  Constitutional: Positive for fever and malaise/fatigue.  HENT: Negative for ear discharge and nosebleeds.   Eyes: Positive for pain.  Respiratory: Positive for cough and shortness of breath.   Cardiovascular: Positive for chest pain. Negative for leg swelling.  Gastrointestinal: Positive for diarrhea and nausea. Negative for abdominal pain and vomiting.  Genitourinary: Negative for dysuria and hematuria.  Musculoskeletal: Negative for joint pain and myalgias.  Skin: Negative for itching and rash.  Neurological: Positive for weakness. Negative for focal weakness and loss of consciousness.  Endo/Heme/Allergies: Negative for polydipsia. Does not bruise/bleed easily.  Psychiatric/Behavioral: Negative for memory loss and substance abuse.    Past Medical History:  Diagnosis Date  . Fatty liver disease, nonalcoholic   . Nephrolithiasis     Past Surgical History:  Procedure Laterality Date  . Bilateral knee arthroscopies    . CHOLECYSTECTOMY    . CYSTOSCOPY/RETROGRADE/URETEROSCOPY Left 09/27/2017   Procedure: CYSTOSCOPY/LEFT RETROGRADE/ URETEROSCOPY/ DOUBLE J STENT;  Surgeon: Marcine Matar, MD;  Location: [facility];  Service: Urology;  Laterality: Left;  Marland Kitchen VARICOSE VEIN SURGERY    .  WISDOM TOOTH EXTRACTION       reports that he has never smoked. He has never used smokeless tobacco. He reports previous alcohol use. He reports that he does not use drugs.  Allergies  Allergen Reactions  . Codeine Nausea And Vomiting    History reviewed. No pertinent family history.  Prior to Admission medications   Medication Sig Start Date End Date Taking? Authorizing Provider  acetaminophen (TYLENOL) 500 MG tablet Take 1,000 mg by mouth every 6 (six) hours as needed for moderate pain or fever.   Yes [provider]  APPLE CIDER VINEGAR PO Take 1 tablet by mouth daily.   Yes [provider]  ascorbic acid (VITAMIN C) 500 MG tablet Take 500 mg by mouth daily.   Yes [provider]  budesonide (PULMICORT) 0.5 MG/2ML nebulizer solution Take 0.5 mg by nebulization in the morning and at bedtime.  01/16/20  Yes [provider]  chlorpheniramine-HYDROcodone (TUSSIONEX) 10-8 MG/5ML SUER Take 2.5 mLs by mouth 2 (two) times daily.  01/21/20  Yes [provider]  Cholecalciferol (VITAMIN D3) 50 MCG (2000 UT) TABS Take 2,000 Units by mouth daily.    Yes [provider]  Digestive Enzymes (SUPER ENZYMES PO) Take 1 tablet by mouth daily.   Yes [provider]  famotidine (PEPCID) 20 MG tablet Take 20 mg by mouth 2 (two) times daily.    Yes [provider]  ibuprofen (ADVIL,MOTRIN) 200 MG tablet Take 400 mg by mouth every 6 (six) hours as needed for fever or moderate pain.    Yes [provider]  promethazine (PHENERGAN) 25 MG tablet 25 mg every 6 (six) hours as needed for nausea or vomiting.  09/25/17  Yes [provider]  psyllium (METAMUCIL) 58.6 % powder Take 1 Scoop by mouth daily.   Yes [provider]  QUERCETIN PO Take 475 mg by mouth daily.    Yes [provider]  zinc sulfate 220 (50 Zn) MG capsule Take 220 mg by mouth 2 (two) times daily.   Yes [provider]    Physical  Exam:  Constitutional: Obese male who appears to be acutely ill Vitals:   01/27/20 1115 01/27/20 1145 01/27/20 1200 01/27/20 1230  BP: 111/75 119/68 121/83 128/77  Pulse: 89 78 72 81  Resp: (!) 34 (!) 29 (!) 29 (!) 31  Temp:      TempSrc:      SpO2: 94% 94% 95% 92%  Weight:      Height:       Eyes: PERRL, lids and conjunctivae normal ENMT: Mucous membranes are dry. Posterior pharynx clear of any exudate or lesions. Normal dentition.  Neck: normal, supple, no masses, no thyromegaly Respiratory: Tachypneic with decreased overall air movement.  No significant wheezes or rhonchi appreciated. Cardiovascular: Regular rate and rhythm, no murmurs / rubs / gallops. No extremity edema. 2+ pedal pulses. No carotid bruits.  Abdomen: no tenderness, no masses palpated. No hepatosplenomegaly. Bowel sounds positive.  Musculoskeletal: no clubbing / cyanosis. No joint deformity upper and lower extremities. Good ROM, no contractures. Normal muscle tone.  Skin: no rashes, lesions, ulcers. No induration Neurologic: CN 2-12 grossly intact. Sensation intact,  DTR normal. Strength 5/5 in all 4.  Psychiatric: Normal judgment and insight. Alert and oriented x 3. Normal mood.     Labs on Admission: I have personally reviewed following labs and imaging studies  CBC: Recent Labs  Lab 01/27/20 0924  WBC 6.0  NEUTROABS 4.7  HGB 12.6*  HCT 39.0  MCV 92.0  PLT 282   Basic Metabolic Panel: Recent Labs  Lab 01/27/20 0924  NA 140  K 3.9  CL 102  CO2 28  GLUCOSE 126*  BUN 7  CREATININE 0.90  CALCIUM 8.3*   GFR: Estimated Creatinine Clearance: 137.7 mL/min (by C-G formula based on SCr of 0.9 mg/dL). Liver Function Tests: Recent Labs  Lab 01/27/20 0924  AST 91*  ALT 64*  ALKPHOS 105  BILITOT 0.9  PROT 5.9*  ALBUMIN 2.7*   No results for input(s): LIPASE, AMYLASE in the last 168 hours. No results for input(s): AMMONIA in the last 168 hours. Coagulation Profile: No results for input(s):  INR, PROTIME in the last 168 hours. Cardiac Enzymes: No results for input(s): CKTOTAL, CKMB, CKMBINDEX, TROPONINI in the last 168 hours. BNP (last 3 results) No results for input(s): PROBNP in the last 8760 hours. HbA1C: No results for input(s): HGBA1C in the last 72 hours. CBG: No results for input(s): GLUCAP in the last 168 hours. Lipid Profile: Recent Labs    01/27/20 0924  TRIG 244*   Thyroid Function Tests: No results for input(s): TSH, T4TOTAL, FREET4, T3FREE, THYROIDAB in the last 72 hours. Anemia Panel: Recent Labs    01/27/20 0924  FERRITIN 2,086*   Urine analysis: No results found for: COLORURINE, APPEARANCEUR, LABSPEC, PHURINE, GLUCOSEU, HGBUR, BILIRUBINUR, KETONESUR, PROTEINUR, UROBILINOGEN, NITRITE, LEUKOCYTESUR Sepsis Labs: Recent Results (from the past 240 hour(s))  Respiratory Panel by RT PCR (Flu A&B, Covid) - Nasopharyngeal Swab     Status: Abnormal   Collection Time: 01/27/20  9:24 AM   Specimen: Nasopharyngeal Swab; Nasopharyngeal(NP) swabs in vial transport medium  Result Value Ref Range Status   SARS Coronavirus 2 by RT PCR POSITIVE (A) NEGATIVE Final    Comment: emailed L. Berdik RN 11:10 01/27/20 ([email or web address]) (NOTE) SARS-CoV-2 target nucleic acids are DETECTED.  SARS-CoV-2 RNA is generally detectable in upper respiratory specimens  during the acute phase of infection. Positive results are indicative of the presence of the identified virus, but do not rule out bacterial infection or co-infection with other pathogens not detected by the test. Clinical correlation with patient history and other diagnostic information is necessary to determine patient infection status. The expected result is Negative.  Fact Sheet for Patients:  https://www.moore.com/  Fact Sheet for Healthcare Providers: https://www.young.biz/  This test is not yet approved or cleared by the [place] FDA and  has been authorized for  detection and/or diagnosis of SARS-CoV-2 by FDA under an Emergency Use Authorization (EUA).  This EUA will remain in effect (meaning this test can be used) for the duration of  the COVID-19  declaration under Section 564(b)(1) of the Act, 21 U.S.C. section 360bbb-3(b)(1), unless the authorization is terminated or revoked sooner.      Influenza A by PCR NEGATIVE NEGATIVE Final   Influenza B by PCR NEGATIVE NEGATIVE Final    Comment: (NOTE) The Xpert Xpress SARS-CoV-2/FLU/RSV assay is intended as an aid in  the diagnosis of influenza from Nasopharyngeal swab specimens and  should not be used as a sole basis for treatment. Nasal washings and  aspirates are unacceptable for Xpert Xpress SARS-CoV-2/FLU/RSV  testing.  Fact Sheet for Patients: https://www.moore.com/  Fact Sheet for Healthcare Providers: https://www.young.biz/  This test is not yet approved or cleared by the [place] FDA and  has been authorized for detection and/or diagnosis of SARS-CoV-2 by  FDA under an Emergency Use Authorization (EUA). This EUA will remain  in effect (meaning this test can be used) for the duration of the  Covid-19 declaration under Section 564(b)(1) of the Act, 21  U.S.C. section 360bbb-3(b)(1), unless the authorization is  terminated or revoked. Performed at [facility], [address], [place], [place] [address]      Radiological Exams on Admission: DG Chest Port 1 View  Result Date: 01/27/2020 CLINICAL DATA:  Increased work of breathing and decreased O2 saturation in the setting of COVID exposure EXAM: PORTABLE CHEST 1 VIEW COMPARISON:  June 21, 2012 FINDINGS: Lung volumes are low. Heart size accentuated by portable technique and low lung volumes likely stable compared to prior study. Hilar structures are obscured by scattered areas of confluent airspace disease in the RIGHT mid chest and lower chest particularly but with scattered nodular  densities throughout the lung base on the RIGHT in the mid chest on the LEFT. No sign of pleural effusion. On limited assessment no acute skeletal process. IMPRESSION: Low lung volumes with scattered areas of confluent airspace disease and scattered nodular densities as described. Findings worrisome for multifocal pneumonia in this patient with history of exposure to COVID-19. Follow-up is suggested to ensure resolution. Electronically Signed   By: Donzetta Kohut M.D.   On: 01/27/2020 09:58    EKG: Independently reviewed.  Sinus rhythm at 98 bpm  Assessment/Plan Acute respiratory failure with hypoxia secondary to pneumonia due to Covid: Patient presented with progressively worsening shortness of breath despite being on home oxygen 4 L and multiple medications including ivermectin by PCP.  O2 saturations at home reported to be in the 50-60s despite being on 4 L of oxygen.  Chest x-ray revealed multifocal pneumonia.  Inflammatory markers significantly elevated including D-dimer 9.51, fibrinogen 705, CRP 20.8, and procalcitonin 0.45.  O2 saturations currently maintained on 10 L of high flow nasal cannula oxygen. -Admit to a progressive bed -Continuous pulse oximetry with high flow nasal cannula oxygen to maintain O2 saturations greater than 90%. -Follow-up CT scan of the chest rule out the possibility of a PE -Albuterol inhaler every 6 hours -Patient initially declined remdesivir with the ED provider, but was amenable to this being started after further talks.  -Discussed monoclonal antibody which he deferred at this time due to history of fatty liver disease -Solu-Medrol IV -Vitamin C and zinc -Pepcid for GI prophylaxis -Continue monitor inflammatory markers daily  Sepsis: Patient was noted to be febrile up to 101.7 F with tachypnea and mild tachycardia.  Lactic acid was reassuring at 1.8.  Blood cultures have been obtained.  Suspect symptoms secondary to COVID-19 pneumonia, but question the  possibility of a superimposed bacterial infection given elevated procalcitonin levels. -Follow-up blood cultures and studies -Empiric antibiotics of Rocephin and azithromycin for the possibility of superimposed bacterial infection  History of fatty liver disease/transaminitis: On admission AST 91, ALT 64. -Continue to monitor  Protein calorie malnutrition: Albumin noted to be low at 2.7. patient admits that he sat poor appetite. -Check prealbumin in a.m. -Ensure shakes with meals  Obesity: BMI 30.17 kg/m  DNR/DNI: Patient states that he does not want to be intubated even if that means that he could die.  He also would not like to be  resuscitated if he were to pass away.  DVT prophylaxis: Lovenox Code Status: DNI/DNR Family Communication: Wife updated Disposition Plan: To be determined Consults called: None Admission status: Inpatient status requiring more than 2 midnight stay due to severe respiratory failure  Clydie Braun MD [facility] Pager [phone]   If 7PM-7AM, please contact night-coverage [email or web address] Password [facility]  01/27/2020, 12:34 PM

## 2020-01-27 NOTE — ED Provider Notes (Addendum)
[facility] EMERGENCY DEPARTMENT Provider Note   CSN: [id number] Arrival date & time: 01/27/20  0845     History Chief Complaint  Patient presents with  . Shortness of Breath    possible covid +    Eric Lutz is a 44 y.o. male with no pertinent past medical history that presents the emergency department today via EMS for shortness of breath.  Patient was placed on 15 L of oxygen at home per EMS, they were able to wean it to 6 L satting at 91.  Patient has had symptoms of shortness of breath, fevers, myalgias, cough for the past 12 days, has been worsening.  Also admits to some rib pain on the right side every time he coughs.  Did have positive exposure to family member with Covid.  Patient has not been vaccinated against Covid.  Denies any history of any lung or cardiac disease.  Patient is present with wife, CODE STATUS DNR and DNI.  Did go over this in depth with patient and wife, wife states that these have been his wishes prior to being diagnosed with Covid.  Did explain that most of the time intubation is necessary with patients that are short of breath with Covid and could be temporary, however patient states that he does not want to be intubated at all.  Patient does not want Remdesivir either.  Is willing to have Decadron at this time.  Is also willing to be admitted.  Has no past medical history. HPI     No past medical history on file.  Patient Active Problem List   Diagnosis Date Noted  . Pneumonia due to COVID-19 virus 01/27/2020  . Calculus of ureter 09/27/2017    Past Surgical History:  Procedure Laterality Date  . Bilateral knee arthroscopies    . CHOLECYSTECTOMY    . CYSTOSCOPY/RETROGRADE/URETEROSCOPY Left 09/27/2017   Procedure: CYSTOSCOPY/LEFT RETROGRADE/ URETEROSCOPY/ DOUBLE J STENT;  Surgeon: Marcine Matar, MD;  Location: [facility];  Service: Urology;  Laterality: Left;  Marland Kitchen VARICOSE VEIN SURGERY    . WISDOM TOOTH EXTRACTION         No  family history on file.  Social History   Tobacco Use  . Smoking status: Never Smoker  . Smokeless tobacco: Never Used  Substance Use Topics  . Alcohol use: Not Currently  . Drug use: Never    Home Medications Prior to Admission medications   Medication Sig Start Date End Date Taking? Authorizing Provider  acetaminophen (TYLENOL) 500 MG tablet Take 1,000 mg by mouth every 6 (six) hours as needed for moderate pain or fever.   Yes [provider]  APPLE CIDER VINEGAR PO Take 1 tablet by mouth daily.   Yes [provider]  ascorbic acid (VITAMIN C) 500 MG tablet Take 500 mg by mouth daily.   Yes [provider]  budesonide (PULMICORT) 0.5 MG/2ML nebulizer solution Take 0.5 mg by nebulization in the morning and at bedtime.  01/16/20  Yes [provider]  chlorpheniramine-HYDROcodone (TUSSIONEX) 10-8 MG/5ML SUER Take 2.5 mLs by mouth 2 (two) times daily.  01/21/20  Yes [provider]  Cholecalciferol (VITAMIN D3) 50 MCG (2000 UT) TABS Take 2,000 Units by mouth daily.    Yes [provider]  Digestive Enzymes (SUPER ENZYMES PO) Take 1 tablet by mouth daily.   Yes [provider]  famotidine (PEPCID) 20 MG tablet Take 20 mg by mouth 2 (two) times daily.    Yes [provider]  ibuprofen (ADVIL,MOTRIN) 200 MG tablet Take 400 mg by mouth every 6 (six) hours as needed for fever or moderate pain.    Yes [provider]  promethazine (PHENERGAN) 25 MG tablet 25 mg every 6 (six) hours as needed for nausea or vomiting.  09/25/17  Yes [provider]  psyllium (METAMUCIL) 58.6 % powder Take 1 Scoop by mouth daily.   Yes [provider]  QUERCETIN PO Take 475 mg by mouth daily.    Yes [provider]  zinc sulfate 220 (50 Zn) MG capsule Take 220 mg by mouth 2 (two) times daily.   Yes [provider]    Allergies    Codeine  Review of Systems   Review of Systems  Constitutional:  Positive for fever. Negative for chills, diaphoresis and fatigue.  HENT: Negative for congestion, sore throat and trouble swallowing.   Eyes: Negative for pain and visual disturbance.  Respiratory: Positive for cough and shortness of breath. Negative for wheezing.   Cardiovascular: Negative for chest pain, palpitations and leg swelling.  Gastrointestinal: Negative for abdominal distention, abdominal pain, diarrhea, nausea and vomiting.  Genitourinary: Negative for difficulty urinating.  Musculoskeletal: Positive for arthralgias and myalgias. Negative for back pain, neck pain and neck stiffness.  Skin: Negative for pallor.  Neurological: Negative for dizziness, speech difficulty, weakness and headaches.  Psychiatric/Behavioral: Negative for confusion.    Physical Exam Updated Vital Signs BP 128/77   Pulse 81   Temp 99.6 F (37.6 C) (Oral)   Resp (!) 31   Ht 6\' 2"  (1.88 m)   Wt 106.6 kg   SpO2 92%   BMI 30.17 kg/m   Physical Exam Constitutional:      General: He is in acute distress.     Appearance: Normal appearance. He is ill-appearing. He is not toxic-appearing or diaphoretic.     Comments: Patient is in acute distress, tachypneic to 40s with accessory muscle use.  Is able to speak to me in short sentences, handling secretions.  HENT:     Mouth/Throat:     Mouth: Mucous membranes are moist.     Pharynx: Oropharynx is clear.  Eyes:     General: No scleral icterus.    Extraocular Movements: Extraocular movements intact.     Pupils: Pupils are equal, round, and reactive to light.  Cardiovascular:     Rate and Rhythm: Regular rhythm. Tachycardia present.     Pulses: Normal pulses.     Heart sounds: Normal heart sounds.  Pulmonary:     Effort: Tachypnea, accessory muscle usage and respiratory distress present.     Breath sounds: No stridor. Rhonchi and rales present. No wheezing.     Comments: No flail chest, no tenderness to palpation on right side of ribs. Chest:      Chest wall: No tenderness.  Abdominal:     General: Abdomen is flat. There is no distension.     Palpations: Abdomen is soft.     Tenderness: There is no abdominal tenderness. There is no guarding or rebound.  Musculoskeletal:        General: No swelling or tenderness. Normal range of motion.     Cervical back: Normal range of motion and neck supple. No rigidity.     Right lower leg: No edema.     Left lower leg: No edema.  Skin:    General: Skin is warm and dry.     Capillary Refill: Capillary refill takes less than 2 seconds.  Coloration: Skin is not pale.  Neurological:     General: No focal deficit present.     Mental Status: He is alert and oriented to person, place, and time.  Psychiatric:        Mood and Affect: Mood normal.        Behavior: Behavior normal.     ED Results / Procedures / Treatments   Labs (all labs ordered are listed, but only abnormal results are displayed) Labs Reviewed  RESPIRATORY PANEL BY RT PCR (FLU A&B, COVID) - Abnormal; Notable for the following components:      Result Value   SARS Coronavirus 2 by RT PCR POSITIVE (*)    All other components within normal limits  CBC WITH DIFFERENTIAL/PLATELET - Abnormal; Notable for the following components:   Hemoglobin 12.6 (*)    Abs Immature Granulocytes 0.11 (*)    All other components within normal limits  COMPREHENSIVE METABOLIC PANEL - Abnormal; Notable for the following components:   Glucose, Bld 126 (*)    Calcium 8.3 (*)    Total Protein 5.9 (*)    Albumin 2.7 (*)    AST 91 (*)    ALT 64 (*)    All other components within normal limits  D-DIMER, QUANTITATIVE ([facility]) - Abnormal; Notable for the following components:   D-Dimer, Quant 9.51 (*)    All other components within normal limits  LACTATE DEHYDROGENASE - Abnormal; Notable for the following components:   LDH 437 (*)    All other components within normal limits  FERRITIN - Abnormal; Notable for the following components:    Ferritin 2,086 (*)    All other components within normal limits  FIBRINOGEN - Abnormal; Notable for the following components:   Fibrinogen 705 (*)    All other components within normal limits  C-REACTIVE PROTEIN - Abnormal; Notable for the following components:   CRP 20.8 (*)    All other components within normal limits  TRIGLYCERIDES - Abnormal; Notable for the following components:   Triglycerides 244 (*)    All other components within normal limits  CULTURE, BLOOD (ROUTINE X 2)  CULTURE, BLOOD (ROUTINE X 2)  LACTIC ACID, PLASMA  LACTIC ACID, PLASMA  PROCALCITONIN    EKG EKG Interpretation  Date/Time:  Monday January 27 2020 09:08:17 EST Ventricular Rate:  98 PR Interval:    QRS Duration: 83 QT Interval:  327 QTC Calculation: 418 R Axis:   21 Text Interpretation: Sinus rhythm Low voltage, precordial leads No significant change since last tracing Confirmed by Lorre NickAllen, Anthony ([id number]) on 01/27/2020 9:10:50 AM   Radiology DG Chest Port 1 View  Result Date: 01/27/2020 CLINICAL DATA:  Increased work of breathing and decreased O2 saturation in the setting of COVID exposure EXAM: PORTABLE CHEST 1 VIEW COMPARISON:  June 21, 2012 FINDINGS: Lung volumes are low. Heart size accentuated by portable technique and low lung volumes likely stable compared to prior study. Hilar structures are obscured by scattered areas of confluent airspace disease in the RIGHT mid chest and lower chest particularly but with scattered nodular densities throughout the lung base on the RIGHT in the mid chest on the LEFT. No sign of pleural effusion. On limited assessment no acute skeletal process. IMPRESSION: Low lung volumes with scattered areas of confluent airspace disease and scattered nodular densities as described. Findings worrisome for multifocal pneumonia in this patient with history of exposure to COVID-19. Follow-up is suggested to ensure resolution. Electronically Signed   By: Donzetta KohutGeoffrey  Wile  M.D.   On:  01/27/2020 09:58    Procedures .Critical Care Performed by: Farrel Gordon, PA-C Authorized by: Farrel Gordon, PA-C   Critical care provider statement:    Critical care time (minutes):  45   Critical care was necessary to treat or prevent imminent or life-threatening deterioration of the following conditions:  Respiratory failure   Critical care was time spent personally by me on the following activities:  Discussions with consultants, evaluation of patient's response to treatment, examination of patient, ordering and performing treatments and interventions, ordering and review of laboratory studies, ordering and review of radiographic studies, pulse oximetry, re-evaluation of patient's condition, obtaining history from patient or surrogate and review of old charts   (including critical care time)  Medications Ordered in ED Medications  acetaminophen (TYLENOL) tablet 1,000 mg (1,000 mg Oral Given 01/27/20 0922)  dexamethasone (DECADRON) injection 10 mg (10 mg Intravenous Given 01/27/20 0930)  ondansetron (ZOFRAN) injection 4 mg (4 mg Intravenous Given 01/27/20 1004)    ED Course  I have reviewed the triage vital signs and the nursing notes.  Pertinent labs & imaging results that were available during my care of the patient were reviewed by me and considered in my medical decision making (see chart for details).    MDM Rules/Calculators/A&P                         Gilbert Manolis is a 44 y.o. male with no pertinent past medical history that presents the emergency department today via EMS for shortness of breath.  Presumed Covid, will obtain Covid labs at this time.  Patient on 6 L and satting at 91%, respiratory therapy will start high flow.  Patient is DNR/DNI, does not want remdesivir at this time.  Patient will need to come into the hospital for further treatment.  Covid positive.  Patient on high flow at this time, 10 L.  Decadron given, due to elevated D-dimer we will also order CT PE  study at this time.  1239 spoke to Dr. Katrinka Blazing, hospitalist who accept the patient.  Still awaiting CT PE study.  Patient remains stable.   The patient appears reasonably stabilized for admission considering the current resources, flow, and capabilities available in the ED at this time, and I doubt any other [facility] requiring further screening and/or treatment in the ED prior to admission.  I discussed this case with my attending physician who cosigned this note including patient's presenting symptoms, physical exam, and planned diagnostics and interventions. Attending physician stated agreement with plan or made changes to plan which were implemented.   Attending physician assessed patient at bedside.  Final Clinical Impression(s) / ED Diagnoses Final diagnoses:  Respiratory distress  Pneumonia due to COVID-19 virus    Rx / DC Orders ED Discharge Orders    None          Farrel Gordon, PA-C 01/27/20 1443    Lorre Nick, MD 01/28/20 [phone]

## 2020-01-27 NOTE — ED Notes (Signed)
RT called per Allena Katz, Saulters applied at 10 L

## 2020-01-27 NOTE — ED Provider Notes (Signed)
Medical screening examination/treatment/procedure(s) were conducted as a shared visit with non-physician practitioner(s) and myself.  I personally evaluated the patient during the encounter.  EKG Interpretation  Date/Time:  Monday January 27 2020 09:08:17 EST Ventricular Rate:  98 PR Interval:    QRS Duration: 83 QT Interval:  327 QTC Calculation: 418 R Axis:   21 Text Interpretation: Sinus rhythm Low voltage, precordial leads No significant change since last tracing Confirmed by Lorre Nick ([id number]) on 01/27/2020 9:26:76 AM  44 year old male who presents with cough and shortness of breath x12 days.  History of Covid exposure.  Patient is not vaccinated.  Patient likely Covid infection.  Was in respiratory distress however he is much better on 4 L of oxygen.  He does not want intubation at this time.  Does not want remdesivir.  Will require admission likely   Lorre Nick, MD 01/27/20 [phone]

## 2020-01-27 NOTE — ED Triage Notes (Signed)
Pt BIB by [facility] after wife called to report pt having increase work of breathing and decrease 02 sat. Pt was placed on 15 L of at home 02. Per EMS, Pt had exposure to a family member who was presumed to be covid positive. Pt has had tx for Covid for last 12 days but has not been verified by testing. EMS was able to ween pt to 4L.   B/P 122/75 RR 38 02 92 6 L

## 2020-01-27 NOTE — Progress Notes (Addendum)
Patient with acute hypoxic respiratory failure due to COVID-19 of pneumonia, I have reviewed indications of Actemra here, I have discussed at length with the patient, he has no history of tuberculosis, acute diverticulitis, bowel perforation, or viral hepatitis infections, these are within acceptable range, except risks and benefits, the emergency FDA approval, he is agreeable, so we will proceed with Actemra. -As well I have explained CODE STATUS to him, and clarified ventilators need sometimes with respiratory failure due to Covid, the discussion with the patient, patient reports if worsening respiratory failure, and he will need ventilatory support for a brief period of time, he would agree to that, so CODE STATUS was changed to partial. -D-dimers at 9.5, will change his DVT prophylaxis to 0.5 mg/kg every 12 hours, will obtain venous Doppler to rule out DVT.Huey Bienenstock MD

## 2020-01-28 ENCOUNTER — Inpatient Hospital Stay (HOSPITAL_COMMUNITY): Payer: PRIVATE HEALTH INSURANCE

## 2020-01-28 ENCOUNTER — Other Ambulatory Visit: Payer: Self-pay

## 2020-01-28 DIAGNOSIS — K760 Fatty (change of) liver, not elsewhere classified: Secondary | ICD-10-CM | POA: Diagnosis not present

## 2020-01-28 DIAGNOSIS — J9601 Acute respiratory failure with hypoxia: Secondary | ICD-10-CM | POA: Diagnosis not present

## 2020-01-28 DIAGNOSIS — U071 COVID-19: Secondary | ICD-10-CM | POA: Diagnosis not present

## 2020-01-28 DIAGNOSIS — E669 Obesity, unspecified: Secondary | ICD-10-CM | POA: Diagnosis not present

## 2020-01-28 LAB — CBC WITH DIFFERENTIAL/PLATELET
Abs Immature Granulocytes: 0.1 10*3/uL — ABNORMAL HIGH (ref 0.00–0.07)
Basophils Absolute: 0 10*3/uL (ref 0.0–0.1)
Basophils Relative: 1 %
Eosinophils Absolute: 0 10*3/uL (ref 0.0–0.5)
Eosinophils Relative: 0 %
HCT: 40.2 % (ref 39.0–52.0)
Hemoglobin: 13 g/dL (ref 13.0–17.0)
Lymphocytes Relative: 5 %
Lymphs Abs: 0.2 10*3/uL — ABNORMAL LOW (ref 0.7–4.0)
MCH: 29.5 pg (ref 26.0–34.0)
MCHC: 32.3 g/dL (ref 30.0–36.0)
MCV: 91.4 fL (ref 80.0–100.0)
Monocytes Absolute: 0.1 10*3/uL (ref 0.1–1.0)
Monocytes Relative: 2 %
Myelocytes: 2 %
Neutro Abs: 4 10*3/uL (ref 1.7–7.7)
Neutrophils Relative %: 90 %
Platelets: 324 10*3/uL (ref 150–400)
RBC: 4.4 MIL/uL (ref 4.22–5.81)
RDW: 13 % (ref 11.5–15.5)
WBC: 4.4 10*3/uL (ref 4.0–10.5)
nRBC: 0 % (ref 0.0–0.2)
nRBC: 1 /100 WBC — ABNORMAL HIGH

## 2020-01-28 LAB — COMPREHENSIVE METABOLIC PANEL
ALT: 78 U/L — ABNORMAL HIGH (ref 0–44)
AST: 83 U/L — ABNORMAL HIGH (ref 15–41)
Albumin: 2.8 g/dL — ABNORMAL LOW (ref 3.5–5.0)
Alkaline Phosphatase: 118 U/L (ref 38–126)
Anion gap: 15 (ref 5–15)
BUN: 12 mg/dL (ref 6–20)
CO2: 29 mmol/L (ref 22–32)
Calcium: 8.9 mg/dL (ref 8.9–10.3)
Chloride: 98 mmol/L (ref 98–111)
Creatinine, Ser: 0.83 mg/dL (ref 0.61–1.24)
GFR, Estimated: >60 mL/min (ref >60)
Glucose, Bld: 166 mg/dL — ABNORMAL HIGH (ref 70–99)
Potassium: 3.8 mmol/L (ref 3.5–5.1)
Sodium: 142 mmol/L (ref 135–145)
Total Bilirubin: 0.7 mg/dL (ref 0.3–1.2)
Total Protein: 6.4 g/dL — ABNORMAL LOW (ref 6.5–8.1)

## 2020-01-28 LAB — PREALBUMIN: Prealbumin: 10.1 mg/dL — ABNORMAL LOW (ref 18–38)

## 2020-01-28 LAB — D-DIMER, QUANTITATIVE: D-Dimer, Quant: 4.55 ug/mL-FEU — ABNORMAL HIGH (ref 0.00–0.50)

## 2020-01-28 LAB — C-REACTIVE PROTEIN: CRP: 19.9 mg/dL — ABNORMAL HIGH (ref <1.0)

## 2020-01-28 LAB — HIV ANTIBODY (ROUTINE TESTING W REFLEX): HIV Screen 4th Generation wRfx: NONREACTIVE

## 2020-01-28 MED ORDER — METHYLPREDNISOLONE SODIUM SUCC 125 MG IJ SOLR
80.0000 mg | Freq: Three times a day (TID) | INTRAMUSCULAR | Status: DC
  Administered 2020-01-28 – 2020-01-30 (×6): 80 mg via INTRAVENOUS
  Filled 2020-01-28 (×6): qty 2

## 2020-01-28 MED ORDER — SALINE SPRAY 0.65 % NA SOLN
1.0000 | NASAL | Status: DC | PRN
  Filled 2020-01-28: qty 44

## 2020-01-28 NOTE — Progress Notes (Signed)
Bilateral lower extremity venous duplex complete.  Please see CV Proc tab for preliminary results. Levin Bacon- RDMS, RVT 12:08 PM  01/28/2020

## 2020-01-28 NOTE — Progress Notes (Signed)
PROGRESS NOTE                                                                             PROGRESS NOTE                                                                                                                                                                                                             Patient Demographics:    Eric Lutz, is a 44 y.o. male, DOB - [birth year], JQG:[id number]  Outpatient Primary MD for the patient is Paulina Fusi, MD    LOS - 1  Admit date - 01/27/2020    Chief Complaint  Patient presents with  . Shortness of Breath    possible covid +       Brief Narrative    HPI: Eric Lutz is a 44 y.o. male  no significant past medical history, diagnosed with COVID-19 12 days ago, presents to ED with worsening dyspnea, fever 102, and report he was given COVID-19, Z-Pak and but desonide inhaler and cough medicine by a physician over the phone, report he was started on oxygen as well by him, and reports progressive dyspnea despite being on 2 to 4 L oxygen which prompted him to come to ED.   Subjective:    Eric Lutz today ports dyspnea has improved, still reports cough, denies any fever, chest pain nausea or vomiting .   Assessment  & Plan :    Principal Problem:   Pneumonia due to COVID-19 virus Active Problems:   Sepsis (HCC)   Acute respiratory failure with hypoxia (HCC)   Fatty liver disease, nonalcoholic   Obesity (BMI 30.0-34.9)   Protein calorie malnutrition (HCC)   DNI (do not intubate)  Acute Hypoxic Resp. Failure due to Acute Covid 19 Viral Pneumonitis during the ongoing 2020 Covid 19 Pandemic  -Fortunately he is unvaccinated. -Chest x-ray significant for bilateral opacities with COVID-19 pneumonia, and does appear he sustained severe lung intimal injury due to COVID-19 of pneumonia. -Continue with IV Solu-Medrol. -Continue with IV  remdesivir. -Received Actemra on admission 01/27/2020 -Continue  with antibiotic coverage including Rocephin and azithromycin given elevated procalcitonin at 0.45 on admission. -Continue to trend inflammatory markers, D-dimers trending down, CTA chest with no evidence of PE, follow on venous Doppler, continue with the [place] 0.5 mg/kg every 12 hours given D-dimer 4.5 today.  Encouraged the patient to sit up in chair in the daytime use I-S and flutter valve for pulmonary toiletry and then prone in bed when at night.  Will advance activity and titrate down oxygen as possible.  Actemra/Baricitinib  off label use - patient was told that if COVID-19 pneumonitis gets worse we might potentially use Actemra off label, patient denies any known history of active diverticulitis, tuberculosis or hepatitis, understands the risks and benefits and wants to proceed with Actemra treatment, he received Actemra 11/22.    SpO2: 95 % O2 Flow Rate (L/min): 7 L/min  Recent Labs  Lab 01/27/20 0920 01/27/20 0924 01/27/20 1058 01/28/20 0150  WBC  --  6.0  --  4.4  PLT  --  282  --  324  CRP  --  20.8*  --  19.9*  DDIMER  --  9.51*  --  4.55*  PROCALCITON  --  0.45  --   --   AST  --  91*  --  83*  ALT  --  64*  --  78*  ALKPHOS  --  105  --  118  BILITOT  --  0.9  --  0.7  ALBUMIN  --  2.7*  --  2.8*  LATICACIDVEN 1.8  --  1.2  --   SARSCOV2NAA  --  POSITIVE*  --   --        ABG  No results found for: PHART, PCO2ART, PO2ART, HCO3, TCO2, ACIDBASEDEF, O2SAT   History of fatty liver disease/transaminitis:  -Teeth elevated in setting of COVID-19 pneumonia -Continue to monitor   Obesity: BMI 30.17 kg/m        Condition - Extremely Guarded  Family Communication  : Discussed with wife Misty Stanley by phone  Code Status : Partial code, patient is okay with ventilator/BiPAP if needed  Consults  :  none  Procedures  :  none  PUD Prophylaxis : PPI  Disposition Plan  :    Status is: Inpatient  Remains inpatient appropriate because:Hemodynamically unstable  and IV treatments appropriate due to intensity of illness or inability to take PO   Dispo: The patient is from: Home              Anticipated d/c is to: Home              Anticipated d/c date is: > 3 days              Patient currently is not medically stable to d/c.      DVT Prophylaxis  :  Lovenox  Lab Results  Component Value Date   PLT 324 01/28/2020    Diet :  Diet Order            Diet regular Room service appropriate? Yes; Fluid consistency: Thin  Diet effective now                  Inpatient Medications  Scheduled Meds: . albuterol  2 puff Inhalation Q6H  . vitamin C  500 mg Oral Daily  . azithromycin  500 mg Oral Daily  . enoxaparin (LOVENOX) injection  50 mg Subcutaneous BID  . famotidine  20 mg Oral BID  . feeding supplement  237 mL Oral BID BM  . methylPREDNISolone (SOLU-MEDROL) injection  80 mg Intravenous Q8H  . pantoprazole  40 mg Oral Daily  . sodium chloride flush  3 mL Intravenous Q12H  . zinc sulfate  220 mg Oral Daily   Continuous Infusions: . cefTRIAXone (ROCEPHIN)  IV Stopped (01/28/20 0900)  . remdesivir 100 mg in NS 100 mL Stopped (01/28/20 1000)   PRN Meds:.acetaminophen, chlorpheniramine-HYDROcodone, guaiFENesin-dextromethorphan, ondansetron **OR** ondansetron (ZOFRAN) IV  Antibiotics  :    Anti-infectives (From admission, onward)   Start     Dose/Rate Route Frequency Ordered Stop   01/28/20 1000  remdesivir 100 mg in sodium chloride 0.9 % 100 mL IVPB       "Followed by" Linked Group Details   100 mg 200 mL/hr over 30 Minutes Intravenous Daily 01/27/20 1337 02/01/20 0959   01/27/20 1500  remdesivir 200 mg in sodium chloride 0.9% 250 mL IVPB       "Followed by" Linked Group Details   200 mg 580 mL/hr over 30 Minutes Intravenous Once 01/27/20 1337 01/27/20 1619   01/27/20 1400  cefTRIAXone (ROCEPHIN) 2 g in sodium chloride 0.9 % 100 mL IVPB        2 g 200 mL/hr over 30 Minutes Intravenous Daily 01/27/20 1338 02/01/20 0959    01/27/20 1345  azithromycin (ZITHROMAX) tablet 500 mg        500 mg Oral Daily 01/27/20 1338 02/01/20 0959        Huey Bienenstockawood Nasra Counce M.D on 01/28/2020 at 11:01 AM  To page go to [email or web address]   [facility] -  Office  [phone]     Objective:   Vitals:   01/27/20 2125 01/27/20 2325 01/28/20 0400 01/28/20 0805  BP: 135/85 134/69 120/77 (!) 127/92  Pulse: 76 65 82 77  Resp: (!) 27 (!) 22 (!) 28 (!) 22  Temp: 97.9 F (36.6 C) 98 F (36.7 C) 98.1 F (36.7 C) 98.1 F (36.7 C)  TempSrc: Oral Axillary Axillary Oral  SpO2: 94% 92% 95% 95%  Weight:      Height:        Wt Readings from Last 3 Encounters:  01/27/20 103.2 kg  09/27/17 102.9 kg     Intake/Output Summary (Last 24 hours) at 01/28/2020 1101 Last data filed at 01/28/2020 0919 Gross per 24 hour  Intake 101.63 ml  Output 700 ml  Net -598.37 ml     Physical Exam  Awake Alert, Oriented X 3, No new F.N deficits, Normal affect Symmetrical Chest wall movement, Good air movement bilaterally, CTAB RRR,No Gallops,Rubs or new Murmurs, No Parasternal Heave +ve B.Sounds, Abd Soft, No tenderness, No rebound - guarding or rigidity. No Cyanosis, Clubbing or edema, No new Rash or bruise       Data Review:    CBC Recent Labs  Lab 01/27/20 0924 01/28/20 0150  WBC 6.0 4.4  HGB 12.6* 13.0  HCT 39.0 40.2  PLT 282 324  MCV 92.0 91.4  MCH 29.7 29.5  MCHC 32.3 32.3  RDW 13.2 13.0  LYMPHSABS 0.7 0.2*  MONOABS 0.4 0.1  EOSABS 0.0 0.0  BASOSABS 0.0 0.0    Recent Labs  Lab 01/27/20 0920 01/27/20 0924 01/27/20 1058 01/28/20 0150  NA  --  140  --  142  K  --  3.9  --  3.8  CL  --  102  --  98  CO2  --  28  --  29  GLUCOSE  --  126*  --  166*  BUN  --  7  --  12  CREATININE  --  0.90  --  0.83  CALCIUM  --  8.3*  --  8.9  AST  --  91*  --  83*  ALT  --  64*  --  78*  ALKPHOS  --  105  --  118  BILITOT  --  0.9  --  0.7  ALBUMIN  --  2.7*  --  2.8*  CRP  --  20.8*  --  19.9*  DDIMER  --  9.51*   --  4.55*  PROCALCITON  --  0.45  --   --   LATICACIDVEN 1.8  --  1.2  --     ------------------------------------------------------------------------------------------------------------------ Recent Labs    01/27/20 0924  TRIG 244*    No results found for: HGBA1C ------------------------------------------------------------------------------------------------------------------ No results for input(s): TSH, T4TOTAL, T3FREE, THYROIDAB in the last 72 hours.  Invalid input(s): FREET3  Cardiac Enzymes No results for input(s): CKMB, TROPONINI, MYOGLOBIN in the last 168 hours.  Invalid input(s): CK ------------------------------------------------------------------------------------------------------------------ No results found for: BNP  Micro Results Recent Results (from the past 240 hour(s))  Blood Culture (routine x 2)     Status: None (Preliminary result)   Collection Time: 01/27/20  9:20 AM   Specimen: BLOOD  Result Value Ref Range Status   Specimen Description BLOOD BLOOD RIGHT HAND  Final   Special Requests   Final    BOTTLES DRAWN AEROBIC AND ANAEROBIC Blood Culture adequate volume   Culture   Final    NO GROWTH < 24 HOURS Performed at [facility], [address], [place], [place] [address]    Report Status PENDING  Incomplete  Respiratory Panel by RT PCR (Flu A&B, Covid) - Nasopharyngeal Swab     Status: Abnormal   Collection Time: 01/27/20  9:24 AM   Specimen: Nasopharyngeal Swab; Nasopharyngeal(NP) swabs in vial transport medium  Result Value Ref Range Status   SARS Coronavirus 2 by RT PCR POSITIVE (A) NEGATIVE Final    Comment: emailed L. Berdik RN 11:10 01/27/20 ([email or web address]) (NOTE) SARS-CoV-2 target nucleic acids are DETECTED.  SARS-CoV-2 RNA is generally detectable in upper respiratory specimens  during the acute phase of infection. Positive results are indicative of the presence of the identified virus, but do not rule out bacterial infection or  co-infection with other pathogens not detected by the test. Clinical correlation with patient history and other diagnostic information is necessary to determine patient infection status. The expected result is Negative.  Fact Sheet for Patients:  https://www.moore.com/  Fact Sheet for Healthcare Providers: https://www.young.biz/  This test is not yet approved or cleared by the [place] FDA and  has been authorized for detection and/or diagnosis of SARS-CoV-2 by FDA under an Emergency Use Authorization (EUA).  This EUA will remain in effect (meaning this test can be used) for the duration of  the COVID-19  declaration under Section 564(b)(1) of the Act, 21 U.S.C. section 360bbb-3(b)(1), unless the authorization is terminated or revoked sooner.      Influenza A by PCR NEGATIVE NEGATIVE Final   Influenza B by PCR NEGATIVE NEGATIVE Final    Comment: (NOTE) The Xpert Xpress SARS-CoV-2/FLU/RSV assay is intended as an aid in  the diagnosis of influenza from Nasopharyngeal swab specimens and  should not be used as a sole basis for treatment. Nasal washings and  aspirates are unacceptable for Xpert Xpress SARS-CoV-2/FLU/RSV  testing.  Fact Sheet for Patients: https://www.moore.com/  Fact Sheet for Healthcare Providers: https://www.young.biz/  This test is not yet approved or cleared by the [place] FDA and  has been authorized for detection and/or diagnosis of SARS-CoV-2 by  FDA under an Emergency Use Authorization (EUA). This EUA will remain  in effect (meaning this test can be used) for the duration of the  Covid-19 declaration under Section 564(b)(1) of the Act, 21  U.S.C. section 360bbb-3(b)(1), unless the authorization is  terminated or revoked. Performed at [facility], [address], [place], [place] [address]   Blood Culture (routine x 2)     Status: None (Preliminary result)    Collection Time: 01/27/20  9:24 AM   Specimen: BLOOD  Result Value Ref Range Status   Specimen Description BLOOD LEFT ANTECUBITAL  Final   Special Requests   Final    BOTTLES DRAWN AEROBIC AND ANAEROBIC Blood Culture adequate volume   Culture   Final    NO GROWTH < 24 HOURS Performed at [facility], [address] [place], [place] [address]    Report Status PENDING  Incomplete    Radiology Reports CT Angio Chest PE W/Cm &/Or Wo Cm  Result Date: 01/27/2020 CLINICAL DATA:  Difficulty breathing. EXAM: CT ANGIOGRAPHY CHEST WITH CONTRAST TECHNIQUE: Multidetector CT imaging of the chest was performed using the standard protocol during bolus administration of intravenous contrast. Multiplanar CT image reconstructions and MIPs were obtained to evaluate the vascular anatomy. CONTRAST:  7mL OMNIPAQUE IOHEXOL 350 MG/ML SOLN COMPARISON:  None. FINDINGS: Cardiovascular: Satisfactory opacification of the pulmonary arteries to the segmental level. No evidence of pulmonary embolism. Normal heart size. No pericardial effusion. Mediastinum/Nodes: No enlarged mediastinal, hilar, or axillary lymph nodes. Thyroid gland, trachea, and esophagus demonstrate no significant findings. Lungs/Pleura: No pneumothorax or pleural effusion is noted. Patchy airspace opacities are noted diffusely throughout both lungs most consistent with multifocal pneumonia due to COVID-19. Upper Abdomen: Hepatic steatosis. Musculoskeletal: No chest wall abnormality. No acute or significant osseous findings. Review of the MIP images confirms the above findings. IMPRESSION: 1. No definite evidence of pulmonary embolus. 2. Patchy airspace opacities are noted diffusely throughout both lungs most consistent with multifocal pneumonia due to COVID-19. 3. Hepatic steatosis. Electronically Signed   By: Lupita Raider M.D.   On: 01/27/2020 15:08   DG Chest Port 1 View  Result Date: 01/27/2020 CLINICAL DATA:  Increased work of breathing and  decreased O2 saturation in the setting of COVID exposure EXAM: PORTABLE CHEST 1 VIEW COMPARISON:  June 21, 2012 FINDINGS: Lung volumes are low. Heart size accentuated by portable technique and low lung volumes likely stable compared to prior study. Hilar structures are obscured by scattered areas of confluent airspace disease in the RIGHT mid chest and lower chest particularly but with scattered nodular densities throughout the lung base on the RIGHT in the mid chest on the LEFT. No sign of pleural effusion. On limited assessment no acute skeletal process. IMPRESSION: Low lung volumes with scattered areas of confluent airspace disease and scattered nodular densities as described. Findings worrisome for multifocal pneumonia in this patient with history of exposure to COVID-19. Follow-up is suggested to ensure resolution. Electronically Signed   By: Donzetta Kohut M.D.   On: 01/27/2020 09:58

## 2020-01-29 DIAGNOSIS — U071 COVID-19: Secondary | ICD-10-CM | POA: Diagnosis not present

## 2020-01-29 DIAGNOSIS — J1282 Pneumonia due to coronavirus disease 2019: Secondary | ICD-10-CM | POA: Diagnosis not present

## 2020-01-29 LAB — CBC WITH DIFFERENTIAL/PLATELET
Abs Immature Granulocytes: 0.31 10*3/uL — ABNORMAL HIGH (ref 0.00–0.07)
Basophils Absolute: 0 10*3/uL (ref 0.0–0.1)
Basophils Relative: 0 %
Eosinophils Absolute: 0 10*3/uL (ref 0.0–0.5)
Eosinophils Relative: 0 %
HCT: 38.6 % — ABNORMAL LOW (ref 39.0–52.0)
Hemoglobin: 12.3 g/dL — ABNORMAL LOW (ref 13.0–17.0)
Immature Granulocytes: 2 %
Lymphocytes Relative: 9 %
Lymphs Abs: 1.4 10*3/uL (ref 0.7–4.0)
MCH: 28.9 pg (ref 26.0–34.0)
MCHC: 31.9 g/dL (ref 30.0–36.0)
MCV: 90.8 fL (ref 80.0–100.0)
Monocytes Absolute: 1 10*3/uL (ref 0.1–1.0)
Monocytes Relative: 7 %
Neutro Abs: 12.5 10*3/uL — ABNORMAL HIGH (ref 1.7–7.7)
Neutrophils Relative %: 82 %
Platelets: 402 10*3/uL — ABNORMAL HIGH (ref 150–400)
RBC: 4.25 MIL/uL (ref 4.22–5.81)
RDW: 12.8 % (ref 11.5–15.5)
WBC: 15.2 10*3/uL — ABNORMAL HIGH (ref 4.0–10.5)
nRBC: 0 % (ref 0.0–0.2)

## 2020-01-29 LAB — COMPREHENSIVE METABOLIC PANEL
ALT: 89 U/L — ABNORMAL HIGH (ref 0–44)
AST: 81 U/L — ABNORMAL HIGH (ref 15–41)
Albumin: 2.7 g/dL — ABNORMAL LOW (ref 3.5–5.0)
Alkaline Phosphatase: 102 U/L (ref 38–126)
Anion gap: 16 — ABNORMAL HIGH (ref 5–15)
BUN: 20 mg/dL (ref 6–20)
CO2: 25 mmol/L (ref 22–32)
Calcium: 9 mg/dL (ref 8.9–10.3)
Chloride: 101 mmol/L (ref 98–111)
Creatinine, Ser: 0.85 mg/dL (ref 0.61–1.24)
GFR, Estimated: >60 mL/min (ref >60)
Glucose, Bld: 165 mg/dL — ABNORMAL HIGH (ref 70–99)
Potassium: 3.8 mmol/L (ref 3.5–5.1)
Sodium: 142 mmol/L (ref 135–145)
Total Bilirubin: 0.4 mg/dL (ref 0.3–1.2)
Total Protein: 6 g/dL — ABNORMAL LOW (ref 6.5–8.1)

## 2020-01-29 LAB — C-REACTIVE PROTEIN: CRP: 10.2 mg/dL — ABNORMAL HIGH (ref <1.0)

## 2020-01-29 LAB — D-DIMER, QUANTITATIVE: D-Dimer, Quant: 3.04 ug/mL-FEU — ABNORMAL HIGH (ref 0.00–0.50)

## 2020-01-29 MED ORDER — ALBUTEROL SULFATE HFA 108 (90 BASE) MCG/ACT IN AERS
2.0000 | INHALATION_SPRAY | Freq: Three times a day (TID) | RESPIRATORY_TRACT | Status: DC
  Administered 2020-01-29 – 2020-01-31 (×7): 2 via RESPIRATORY_TRACT

## 2020-01-29 MED ORDER — ALBUTEROL SULFATE HFA 108 (90 BASE) MCG/ACT IN AERS: 2.0000 | INHALATION_SPRAY | Freq: Four times a day (QID) | RESPIRATORY_TRACT | Status: DC | PRN

## 2020-01-29 NOTE — Plan of Care (Signed)

## 2020-01-29 NOTE — Progress Notes (Signed)
PROGRESS NOTE                                                                             PROGRESS NOTE                                                                                                                                                                                                             Patient Demographics:    Eric Lutz, is a 44 y.o. male, DOB - 1975-09-04, WGN:[id number]  Outpatient Primary MD for the patient is Paulina Fusi, MD    LOS - 2  Admit date - 01/27/2020    Chief Complaint  Patient presents with  . Shortness of Breath    possible covid +       Brief Narrative    HPI: Eric Lutz is a 44 y.o. male  no significant past medical history, diagnosed with COVID-19 12 days ago, presents to ED with worsening dyspnea, fever 102, and report he was given COVID-19, Z-Pak and but desonide inhaler and cough medicine by a physician over the phone, report he was started on oxygen as well by him, and reports progressive dyspnea despite being on 2 to 4 L oxygen which prompted him to come to ED.   Subjective:    Eric Lutz today ports dyspnea has improved, still reports cough, denies any fever, chest pain nausea or vomiting .   Assessment  & Plan :    Acute Hypoxic Resp. Failure due to Acute Covid 19 Viral Pneumonitis during the ongoing 2020 Covid 19 Pandemic  - Unfortunately he is unvaccinated.  He has incurred severe parenchymal lung injury, been treated appropriately with IV steroids remdesivir and Actemra.  Thankfully he is responding to the treatment.  Oxygen requirements gradually coming down.  Continue to monitor closely.  Encouraged the patient to sit up in chair in the daytime use I-S and flutter valve for pulmonary toiletry and then prone in bed when at night.  Will advance activity and titrate down oxygen as possible.  SpO2: 94 % O2 Flow Rate (L/min): 5 L/min  Recent Labs  Lab 01/27/20 0920 01/27/20 0924  01/27/20 1058 01/28/20 0150 01/29/20 0028  WBC  --  6.0  --  4.4 15.2*  PLT  --  282  --  324 402*  CRP  --  20.8*  --  19.9* 10.2*  DDIMER  --  9.51*  --  4.55* 3.04*  PROCALCITON  --  0.45  --   --   --   AST  --  91*  --  83* 81*  ALT  --  64*  --  78* 89*  ALKPHOS  --  105  --  118 102  BILITOT  --  0.9  --  0.7 0.4  ALBUMIN  --  2.7*  --  2.8* 2.7*  LATICACIDVEN 1.8  --  1.2  --   --   SARSCOV2NAA  --  POSITIVE*  --   --   --      History of fatty liver disease/transaminitis: Likely due to Covid infection.  Trend stable.   Obesity: BMI 30.17 kg/m, follow with PCP for weight loss.  Mildly elevated D-dimer due to inflammation.  Negative ultrasound.  Continue moderate dose Lovenox.    Condition - Extremely Guarded  Family Communication  : Discussed with wife Eric Lutz by phone [phone] - 01/29/20  Code Status : Partial code, patient is okay with ventilator/BiPAP if needed  Consults  :  none  Procedures  :    Leg [place] - no DVT  PUD Prophylaxis : PPI  Disposition Plan  :    Status is: Inpatient  Remains inpatient appropriate because:Hemodynamically unstable and IV treatments appropriate due to intensity of illness or inability to take PO   Dispo: The patient is from: Home              Anticipated d/c is to: Home              Anticipated d/c date is: > 3 days              Patient currently is not medically stable to d/c.   DVT Prophylaxis  :  Lovenox  Lab Results  Component Value Date   PLT 402 (H) 01/29/2020    Diet :  Diet Order            Diet regular Room service appropriate? Yes; Fluid consistency: Thin  Diet effective now                  Inpatient Medications  Scheduled Meds: . albuterol  2 puff Inhalation TID  . vitamin C  500 mg Oral Daily  . azithromycin  500 mg Oral Daily  . enoxaparin (LOVENOX) injection  50 mg Subcutaneous BID  . famotidine  20 mg Oral BID  . feeding supplement  237 mL Oral BID BM  . methylPREDNISolone  (SOLU-MEDROL) injection  80 mg Intravenous Q8H  . pantoprazole  40 mg Oral Daily  . sodium chloride flush  3 mL Intravenous Q12H  . zinc sulfate  220 mg Oral Daily   Continuous Infusions: . cefTRIAXone (ROCEPHIN)  IV 2 g (01/29/20 0948)  . remdesivir 100 mg in NS 100 mL 100 mg (01/29/20 0909)   PRN Meds:.acetaminophen, albuterol, chlorpheniramine-HYDROcodone, guaiFENesin-dextromethorphan, ondansetron **OR** ondansetron (ZOFRAN) IV, sodium chloride  Antibiotics  :    Anti-infectives (From admission, onward)   Start     Dose/Rate Route Frequency Ordered Stop   01/28/20 1000  remdesivir  100 mg in sodium chloride 0.9 % 100 mL IVPB       "Followed by" Linked Group Details   100 mg 200 mL/hr over 30 Minutes Intravenous Daily 01/27/20 1337 02/01/20 0959   01/27/20 1500  remdesivir 200 mg in sodium chloride 0.9% 250 mL IVPB       "Followed by" Linked Group Details   200 mg 580 mL/hr over 30 Minutes Intravenous Once 01/27/20 1337 01/27/20 1619   01/27/20 1400  cefTRIAXone (ROCEPHIN) 2 g in sodium chloride 0.9 % 100 mL IVPB        2 g 200 mL/hr over 30 Minutes Intravenous Daily 01/27/20 1338 02/01/20 0959   01/27/20 1345  azithromycin (ZITHROMAX) tablet 500 mg        500 mg Oral Daily 01/27/20 1338 02/01/20 0959        Susa Raring M.D on 01/29/2020 at 10:23 AM  To page go to [email or web address]   [facility] -  Office  [phone]     Objective:   Vitals:   01/28/20 1946 01/28/20 2347 01/29/20 0418 01/29/20 0729  BP: 140/78 134/80 108/66 (!) 143/88  Pulse: 88 75 (!) 58 78  Resp: 20 18 19 16   Temp: 98.1 F (36.7 C) 98 F (36.7 C) 98.1 F (36.7 C) 97.8 F (36.6 C)  TempSrc: Axillary Axillary Axillary Oral  SpO2: 97% 100% 99% 94%  Weight:      Height:        Wt Readings from Last 3 Encounters:  01/27/20 103.2 kg  09/27/17 102.9 kg     Intake/Output Summary (Last 24 hours) at 01/29/2020 1023 Last data filed at 01/29/2020 0846 Gross per 24 hour  Intake 943.65  ml  Output --  Net 943.65 ml     Physical Exam  Awake Alert, No new F.N deficits, Normal affect [place].AT,PERRAL Supple Neck,No JVD, No cervical lymphadenopathy appriciated.  Symmetrical Chest wall movement, Good air movement bilaterally, CTAB RRR,No Gallops, Rubs or new Murmurs, No Parasternal Heave +ve B.Sounds, Abd Soft, No tenderness, No organomegaly appriciated, No rebound - guarding or rigidity. No Cyanosis, Clubbing or edema, No new Rash or bruise    Data Review:    CBC Recent Labs  Lab 01/27/20 0924 01/28/20 0150 01/29/20 0028  WBC 6.0 4.4 15.2*  HGB 12.6* 13.0 12.3*  HCT 39.0 40.2 38.6*  PLT 282 324 402*  MCV 92.0 91.4 90.8  MCH 29.7 29.5 28.9  MCHC 32.3 32.3 31.9  RDW 13.2 13.0 12.8  LYMPHSABS 0.7 0.2* 1.4  MONOABS 0.4 0.1 1.0  EOSABS 0.0 0.0 0.0  BASOSABS 0.0 0.0 0.0    Recent Labs  Lab 01/27/20 0920 01/27/20 0924 01/27/20 1058 01/28/20 0150 01/29/20 0028  NA  --  140  --  142 142  K  --  3.9  --  3.8 3.8  CL  --  102  --  98 101  CO2  --  28  --  29 25  GLUCOSE  --  126*  --  166* 165*  BUN  --  7  --  12 20  CREATININE  --  0.90  --  0.83 0.85  CALCIUM  --  8.3*  --  8.9 9.0  AST  --  91*  --  83* 81*  ALT  --  64*  --  78* 89*  ALKPHOS  --  105  --  118 102  BILITOT  --  0.9  --  0.7 0.4  ALBUMIN  --  2.7*  --  2.8* 2.7*  CRP  --  20.8*  --  19.9* 10.2*  DDIMER  --  9.51*  --  4.55* 3.04*  PROCALCITON  --  0.45  --   --   --   LATICACIDVEN 1.8  --  1.2  --   --     ------------------------------------------------------------------------------------------------------------------ Recent Labs    01/27/20 0924  TRIG 244*    No results found for: HGBA1C ------------------------------------------------------------------------------------------------------------------ No results for input(s): TSH, T4TOTAL, T3FREE, THYROIDAB in the last 72 hours.  Invalid input(s): FREET3  Cardiac Enzymes No results for input(s): CKMB, TROPONINI,  MYOGLOBIN in the last 168 hours.  Invalid input(s): CK ------------------------------------------------------------------------------------------------------------------ No results found for: BNP  Micro Results Recent Results (from the past 240 hour(s))  Blood Culture (routine x 2)     Status: None (Preliminary result)   Collection Time: 01/27/20  9:20 AM   Specimen: BLOOD  Result Value Ref Range Status   Specimen Description BLOOD BLOOD RIGHT HAND  Final   Special Requests   Final    BOTTLES DRAWN AEROBIC AND ANAEROBIC Blood Culture adequate volume   Culture   Final    NO GROWTH 2 DAYS Performed at [facility], [address], [place], [place] [address]    Report Status PENDING  Incomplete  Respiratory Panel by RT PCR (Flu A&B, Covid) - Nasopharyngeal Swab     Status: Abnormal   Collection Time: 01/27/20  9:24 AM   Specimen: Nasopharyngeal Swab; Nasopharyngeal(NP) swabs in vial transport medium  Result Value Ref Range Status   SARS Coronavirus 2 by RT PCR POSITIVE (A) NEGATIVE Final    Comment: emailed L. Berdik RN 11:10 01/27/20 ([email or web address]) (NOTE) SARS-CoV-2 target nucleic acids are DETECTED.  SARS-CoV-2 RNA is generally detectable in upper respiratory specimens  during the acute phase of infection. Positive results are indicative of the presence of the identified virus, but do not rule out bacterial infection or co-infection with other pathogens not detected by the test. Clinical correlation with patient history and other diagnostic information is necessary to determine patient infection status. The expected result is Negative.  Fact Sheet for Patients:  https://www.moore.com/  Fact Sheet for Healthcare Providers: https://www.young.biz/  This test is not yet approved or cleared by the [place] FDA and  has been authorized for detection and/or diagnosis of SARS-CoV-2 by FDA under an Emergency Use Authorization (EUA).  This  EUA will remain in effect (meaning this test can be used) for the duration of  the COVID-19  declaration under Section 564(b)(1) of the Act, 21 U.S.C. section 360bbb-3(b)(1), unless the authorization is terminated or revoked sooner.      Influenza A by PCR NEGATIVE NEGATIVE Final   Influenza B by PCR NEGATIVE NEGATIVE Final    Comment: (NOTE) The Xpert Xpress SARS-CoV-2/FLU/RSV assay is intended as an aid in  the diagnosis of influenza from Nasopharyngeal swab specimens and  should not be used as a sole basis for treatment. Nasal washings and  aspirates are unacceptable for Xpert Xpress SARS-CoV-2/FLU/RSV  testing.  Fact Sheet for Patients: https://www.moore.com/  Fact Sheet for Healthcare Providers: https://www.young.biz/  This test is not yet approved or cleared by the [place] FDA and  has been authorized for detection and/or diagnosis of SARS-CoV-2 by  FDA under an Emergency Use Authorization (EUA). This EUA will remain  in effect (meaning this test can be used) for the duration of the  Covid-19 declaration under Section 564(b)(1) of the Act, 21  U.S.C. section 360bbb-3(b)(1), unless the authorization is  terminated or revoked. Performed at [facility], [address] [place], [place] [address]   Blood Culture (routine x 2)     Status: None (Preliminary result)   Collection Time: 01/27/20  9:24 AM   Specimen: BLOOD  Result Value Ref Range Status   Specimen Description BLOOD LEFT ANTECUBITAL  Final   Special Requests   Final    BOTTLES DRAWN AEROBIC AND ANAEROBIC Blood Culture adequate volume   Culture   Final    NO GROWTH 2 DAYS Performed at [facility], [address], [place], [place] [address]    Report Status PENDING  Incomplete    Radiology Reports CT Angio Chest PE W/Cm &/Or Wo Cm  Result Date: 01/27/2020 CLINICAL DATA:  Difficulty breathing. EXAM: CT ANGIOGRAPHY CHEST WITH CONTRAST TECHNIQUE:  Multidetector CT imaging of the chest was performed using the standard protocol during bolus administration of intravenous contrast. Multiplanar CT image reconstructions and MIPs were obtained to evaluate the vascular anatomy. CONTRAST:  75mL OMNIPAQUE IOHEXOL 350 MG/ML SOLN COMPARISON:  None. FINDINGS: Cardiovascular: Satisfactory opacification of the pulmonary arteries to the segmental level. No evidence of pulmonary embolism. Normal heart size. No pericardial effusion. Mediastinum/Nodes: No enlarged mediastinal, hilar, or axillary lymph nodes. Thyroid gland, trachea, and esophagus demonstrate no significant findings. Lungs/Pleura: No pneumothorax or pleural effusion is noted. Patchy airspace opacities are noted diffusely throughout both lungs most consistent with multifocal pneumonia due to COVID-19. Upper Abdomen: Hepatic steatosis. Musculoskeletal: No chest wall abnormality. No acute or significant osseous findings. Review of the MIP images confirms the above findings. IMPRESSION: 1. No definite evidence of pulmonary embolus. 2. Patchy airspace opacities are noted diffusely throughout both lungs most consistent with multifocal pneumonia due to COVID-19. 3. Hepatic steatosis. Electronically Signed   By: Lupita Raider M.D.   On: 01/27/2020 15:08   DG Chest Port 1 View  Result Date: 01/27/2020 CLINICAL DATA:  Increased work of breathing and decreased O2 saturation in the setting of COVID exposure EXAM: PORTABLE CHEST 1 VIEW COMPARISON:  June 21, 2012 FINDINGS: Lung volumes are low. Heart size accentuated by portable technique and low lung volumes likely stable compared to prior study. Hilar structures are obscured by scattered areas of confluent airspace disease in the RIGHT mid chest and lower chest particularly but with scattered nodular densities throughout the lung base on the RIGHT in the mid chest on the LEFT. No sign of pleural effusion. On limited assessment no acute skeletal process. IMPRESSION:  Low lung volumes with scattered areas of confluent airspace disease and scattered nodular densities as described. Findings worrisome for multifocal pneumonia in this patient with history of exposure to COVID-19. Follow-up is suggested to ensure resolution. Electronically Signed   By: Donzetta Kohut M.D.   On: 01/27/2020 09:58   VAS [place] LOWER EXTREMITY VENOUS (DVT)  Result Date: 01/28/2020  Lower Venous DVT Study Indications: Elevated D dimer. Other Indications: Covid +. Performing Technologist: Levada Schilling RDMS, RVT  Examination Guidelines: A complete evaluation includes B-mode imaging, spectral Doppler, color Doppler, and power Doppler as needed of all accessible portions of each vessel. Bilateral testing is considered an integral part of a complete examination. Limited examinations for reoccurring indications may be performed as noted. The reflux portion of the exam is performed with the patient in reverse Trendelenburg.  +---------+---------------+---------+-----------+----------+--------------+ RIGHT    CompressibilityPhasicitySpontaneityPropertiesThrombus Aging +---------+---------------+---------+-----------+----------+--------------+ CFV      Full  Yes      Yes                                 +---------+---------------+---------+-----------+----------+--------------+ SFJ      Full                                                        +---------+---------------+---------+-----------+----------+--------------+ FV Prox  Full                                                        +---------+---------------+---------+-----------+----------+--------------+ FV Mid   Full                                                        +---------+---------------+---------+-----------+----------+--------------+ FV DistalFull                                                        +---------+---------------+---------+-----------+----------+--------------+ PFV      Full                                                         +---------+---------------+---------+-----------+----------+--------------+ POP      Full           Yes      Yes                                 +---------+---------------+---------+-----------+----------+--------------+ PTV      Full                                                        +---------+---------------+---------+-----------+----------+--------------+ PERO     Full                                                        +---------+---------------+---------+-----------+----------+--------------+ GSV      Full                                                        +---------+---------------+---------+-----------+----------+--------------+   +---------+---------------+---------+-----------+----------+--------------+ LEFT     CompressibilityPhasicitySpontaneityPropertiesThrombus  Aging +---------+---------------+---------+-----------+----------+--------------+ CFV      Full           Yes      Yes                                 +---------+---------------+---------+-----------+----------+--------------+ SFJ      Full                                                        +---------+---------------+---------+-----------+----------+--------------+ FV Prox  Full                                                        +---------+---------------+---------+-----------+----------+--------------+ FV Mid   Full                                                        +---------+---------------+---------+-----------+----------+--------------+ FV DistalFull                                                        +---------+---------------+---------+-----------+----------+--------------+ PFV      Full                                                        +---------+---------------+---------+-----------+----------+--------------+ POP      Full           Yes      Yes                                  +---------+---------------+---------+-----------+----------+--------------+ PTV      Full                                                        +---------+---------------+---------+-----------+----------+--------------+ PERO     Full                                                        +---------+---------------+---------+-----------+----------+--------------+ GSV      Full                                                        +---------+---------------+---------+-----------+----------+--------------+  Summary: RIGHT: - There is no evidence of deep vein thrombosis in the lower extremity.  - No cystic structure found in the popliteal fossa.  LEFT: - There is no evidence of deep vein thrombosis in the lower extremity.  - No cystic structure found in the popliteal fossa.  *See table(s) above for measurements and observations.    Preliminary

## 2020-01-30 DIAGNOSIS — U071 COVID-19: Secondary | ICD-10-CM | POA: Diagnosis not present

## 2020-01-30 DIAGNOSIS — J1282 Pneumonia due to coronavirus disease 2019: Secondary | ICD-10-CM | POA: Diagnosis not present

## 2020-01-30 DIAGNOSIS — J9601 Acute respiratory failure with hypoxia: Secondary | ICD-10-CM | POA: Diagnosis not present

## 2020-01-30 DIAGNOSIS — K760 Fatty (change of) liver, not elsewhere classified: Secondary | ICD-10-CM | POA: Diagnosis not present

## 2020-01-30 LAB — COMPREHENSIVE METABOLIC PANEL
ALT: 161 U/L — ABNORMAL HIGH (ref 0–44)
AST: 137 U/L — ABNORMAL HIGH (ref 15–41)
Albumin: 2.8 g/dL — ABNORMAL LOW (ref 3.5–5.0)
Alkaline Phosphatase: 99 U/L (ref 38–126)
Anion gap: 13 (ref 5–15)
BUN: 20 mg/dL (ref 6–20)
CO2: 27 mmol/L (ref 22–32)
Calcium: 8.7 mg/dL — ABNORMAL LOW (ref 8.9–10.3)
Chloride: 102 mmol/L (ref 98–111)
Creatinine, Ser: 1.01 mg/dL (ref 0.61–1.24)
GFR, Estimated: >60 mL/min (ref >60)
Glucose, Bld: 157 mg/dL — ABNORMAL HIGH (ref 70–99)
Potassium: 3.8 mmol/L (ref 3.5–5.1)
Sodium: 142 mmol/L (ref 135–145)
Total Bilirubin: 0.6 mg/dL (ref 0.3–1.2)
Total Protein: 5.5 g/dL — ABNORMAL LOW (ref 6.5–8.1)

## 2020-01-30 LAB — CBC WITH DIFFERENTIAL/PLATELET
Abs Immature Granulocytes: 0.69 10*3/uL — ABNORMAL HIGH (ref 0.00–0.07)
Basophils Absolute: 0 10*3/uL (ref 0.0–0.1)
Basophils Relative: 0 %
Eosinophils Absolute: 0 10*3/uL (ref 0.0–0.5)
Eosinophils Relative: 0 %
HCT: 37.7 % — ABNORMAL LOW (ref 39.0–52.0)
Hemoglobin: 12.3 g/dL — ABNORMAL LOW (ref 13.0–17.0)
Immature Granulocytes: 4 %
Lymphocytes Relative: 8 %
Lymphs Abs: 1.3 10*3/uL (ref 0.7–4.0)
MCH: 29.9 pg (ref 26.0–34.0)
MCHC: 32.6 g/dL (ref 30.0–36.0)
MCV: 91.7 fL (ref 80.0–100.0)
Monocytes Absolute: 1.1 10*3/uL — ABNORMAL HIGH (ref 0.1–1.0)
Monocytes Relative: 6 %
Neutro Abs: 14.3 10*3/uL — ABNORMAL HIGH (ref 1.7–7.7)
Neutrophils Relative %: 82 %
Platelets: 490 10*3/uL — ABNORMAL HIGH (ref 150–400)
RBC: 4.11 MIL/uL — ABNORMAL LOW (ref 4.22–5.81)
RDW: 12.7 % (ref 11.5–15.5)
WBC: 17.4 10*3/uL — ABNORMAL HIGH (ref 4.0–10.5)
nRBC: 0 % (ref 0.0–0.2)

## 2020-01-30 LAB — D-DIMER, QUANTITATIVE: D-Dimer, Quant: 2.23 ug/mL-FEU — ABNORMAL HIGH (ref 0.00–0.50)

## 2020-01-30 LAB — STREP PNEUMONIAE URINARY ANTIGEN: Strep Pneumo Urinary Antigen: NEGATIVE

## 2020-01-30 LAB — C-REACTIVE PROTEIN: CRP: 5.2 mg/dL — ABNORMAL HIGH (ref <1.0)

## 2020-01-30 MED ORDER — METHYLPREDNISOLONE SODIUM SUCC 125 MG IJ SOLR
60.0000 mg | Freq: Two times a day (BID) | INTRAMUSCULAR | Status: DC
  Administered 2020-01-30 – 2020-01-31 (×2): 60 mg via INTRAVENOUS
  Filled 2020-01-30 (×2): qty 2

## 2020-01-30 NOTE — Progress Notes (Signed)
PROGRESS NOTE                                                                             PROGRESS NOTE                                                                                                                                                                                                             Patient Demographics:    Eric Lutz, is a 44 y.o. male, DOB - 1975/12/23, GEZ:[id number]  Outpatient Primary MD for the patient is Paulina Fusi, MD    LOS - 3  Admit date - 01/27/2020    Chief Complaint  Patient presents with  . Shortness of Breath    possible covid +       Brief Narrative    HPI: Eric Lutz is a 44 y.o. male  no significant past medical history, diagnosed with COVID-19 (unvaccinated) 12 days prior to this hospitalization-presented with shortness of breath-found to have severe acute hypoxic respiratory failure due to COVID-19 pneumonia.  Admitted to the hospitalist service for further evaluation and treatment.   Subjective:   Feels better-on 4 L of oxygen.   Assessment  & Plan :    Acute Hypoxic Resp. Failure due to COVID-19 pneumonia: Had severe parenchymal injury on presentation-treated with steroids/Actemra and Remdesivir.  Improving rapidly-hypoxia improving-Down to 4 L of oxygen.  Do not think he had bacterial pneumonia-therefore will go ahead and stop Rocephin and Zithromax today.  Continue supportive care-continue attempts to titrate down FiO2.  Will need to assess for home O2 requirement prior to discharge.    SpO2: 95 % O2 Flow Rate (L/min): (S) 4 L/min  Recent Labs  Lab 01/27/20 0920 01/27/20 0924 01/27/20 1058 01/28/20 0150 01/29/20 0028 01/30/20 0020  WBC  --  6.0  --  4.4 15.2* 17.4*  PLT  --  282  --  324 402* 490*  CRP  --  20.8*  --  19.9* 10.2* 5.2*  DDIMER  --  9.51*  --  4.55*  3.04* 2.23*  PROCALCITON  --  0.45  --   --   --   --   AST  --  91*  --  83* 81* 137*  ALT  --  64*  --  78*  89* 161*  ALKPHOS  --  105  --  118 102 99  BILITOT  --  0.9  --  0.7 0.4 0.6  ALBUMIN  --  2.7*  --  2.8* 2.7* 2.8*  LATICACIDVEN 1.8  --  1.2  --   --   --   SARSCOV2NAA  --  POSITIVE*  --   --   --   --     Transaminitis: Secondary to COVID-19-trend for now-doubt further work-up required.  Should improve over the course of the next few days.  Minimally elevated D-dimer: Secondary to COVID-19 related inflammation-hypoxia has improved-lower extremity Doppler CTA chest negative for VTE  History of hepatic steatosis: Stable for outpatient follow-up with PCP.  History of fatty liver disease/transaminitis: Likely due to Covid infection.  Trend stable.   Obesity: BMI 30.17 kg/m, follow with PCP for weight loss.  Condition - Guarded  Family Communication  : Discussed with wife Misty StanleyLisa by phone [phone] -on 11/25  Code Status : Partial code, patient is okay with ventilator/BiPAP if needed  Consults  :  none  Procedures  :    Leg US - no DVT  PUD Prophylaxis : PPI  Disposition Plan  :    Status is: Inpatient  Remains inpatient appropriate because:Hemodynamically unstable and IV treatments appropriate due to intensity of illness or inability to take PO   Dispo: The patient is from: Home              Anticipated d/c is to: Home              Anticipated d/c date is: > 1-2  days              Patient currently is not medically stable to d/c.   DVT Prophylaxis  :  Lovenox  Lab Results  Component Value Date   PLT 490 (H) 01/30/2020    Diet :  Diet Order            Diet regular Room service appropriate? Yes; Fluid consistency: Thin  Diet effective now                  Inpatient Medications  Scheduled Meds: . albuterol  2 puff Inhalation TID  . vitamin C  500 mg Oral Daily  . azithromycin  500 mg Oral Daily  . enoxaparin (LOVENOX) injection  50 mg Subcutaneous BID  . famotidine  20 mg Oral BID  . feeding supplement  237 mL Oral BID BM  . methylPREDNISolone  (SOLU-MEDROL) injection  80 mg Intravenous Q8H  . pantoprazole  40 mg Oral Daily  . sodium chloride flush  3 mL Intravenous Q12H  . zinc sulfate  220 mg Oral Daily   Continuous Infusions: . cefTRIAXone (ROCEPHIN)  IV 2 g (01/30/20 1020)  . remdesivir 100 mg in NS 100 mL 100 mg (01/30/20 1117)   PRN Meds:.acetaminophen, albuterol, chlorpheniramine-HYDROcodone, guaiFENesin-dextromethorphan, ondansetron **OR** ondansetron (ZOFRAN) IV, sodium chloride  Antibiotics  :    Anti-infectives (From admission, onward)   Start     Dose/Rate Route Frequency Ordered Stop   01/28/20 1000  remdesivir 100 mg in sodium chloride 0.9 % 100 mL IVPB       "Followed by" Linked Group  Details   100 mg 200 mL/hr over 30 Minutes Intravenous Daily 01/27/20 1337 02/01/20 0959   01/27/20 1500  remdesivir 200 mg in sodium chloride 0.9% 250 mL IVPB       "Followed by" Linked Group Details   200 mg 580 mL/hr over 30 Minutes Intravenous Once 01/27/20 1337 01/27/20 1619   01/27/20 1400  cefTRIAXone (ROCEPHIN) 2 g in sodium chloride 0.9 % 100 mL IVPB        2 g 200 mL/hr over 30 Minutes Intravenous Daily 01/27/20 1338 02/01/20 0959   01/27/20 1345  azithromycin (ZITHROMAX) tablet 500 mg        500 mg Oral Daily 01/27/20 1338 02/01/20 0959        Jeoffrey Massed M.D on 01/30/2020 at 11:46 AM  To page go to [email or web address]   [facility] -  Office  [phone]     Objective:   Vitals:   01/29/20 2130 01/30/20 0007 01/30/20 0403 01/30/20 0749  BP:  (!) 145/80 104/81 131/89  Pulse:  68 61 60  Resp:  Temp:  97.6 F (36.4 C) 97.7 F (36.5 C) 98.3 F (36.8 C)  TempSrc:  Oral Oral Oral  SpO2: 94%  97% 95%  Weight:      Height:        Wt Readings from Last 3 Encounters:  01/27/20 103.2 kg  09/27/17 102.9 kg     Intake/Output Summary (Last 24 hours) at 01/30/2020 1146 Last data filed at 01/30/2020 0852 Gross per 24 hour  Intake 765 ml  Output 1270 ml  Net -505 ml     Physical  Exam Gen Exam:Alert awake-not in any distress HEENT:atraumatic, normocephalic Chest: B/L clear to auscultation anteriorly CVS:S1S2 regular Abdomen:soft non tender, non distended Extremities:no edema Neurology: Non focal Skin: no rash    Data Review:    CBC Recent Labs  Lab 01/27/20 0924 01/28/20 0150 01/29/20 0028 01/30/20 0020  WBC 6.0 4.4 15.2* 17.4*  HGB 12.6* 13.0 12.3* 12.3*  HCT 39.0 40.2 38.6* 37.7*  PLT 282 324 402* 490*  MCV 92.0 91.4 90.8 91.7  MCH 29.7 29.5 28.9 29.9  MCHC 32.3 32.3 31.9 32.6  RDW 13.2 13.0 12.8 12.7  LYMPHSABS 0.7 0.2* 1.4 1.3  MONOABS 0.4 0.1 1.0 1.1*  EOSABS 0.0 0.0 0.0 0.0  BASOSABS 0.0 0.0 0.0 0.0    Recent Labs  Lab 01/27/20 0920 01/27/20 0924 01/27/20 1058 01/28/20 0150 01/29/20 0028 01/30/20 0020  NA  --  140  --  142 142 142  K  --  3.9  --  3.8 3.8 3.8  CL  --  102  --  98 101 102  CO2  --  28  --  GLUCOSE  --  126*  --  166* 165* 157*  BUN  --  7  --  CREATININE  --  0.90  --  0.83 0.85 1.01  CALCIUM  --  8.3*  --  8.9 9.0 8.7*  AST  --  91*  --  83* 81* 137*  ALT  --  64*  --  78* 89* 161*  ALKPHOS  --  105  --  118 102 99  BILITOT  --  0.9  --  0.7 0.4 0.6  ALBUMIN  --  2.7*  --  2.8* 2.7* 2.8*  CRP  --  20.8*  --  19.9* 10.2* 5.2*  DDIMER  --  9.51*  --  4.55* 3.04* 2.23*  PROCALCITON  --  0.45  --   --   --   --   LATICACIDVEN 1.8  --  1.2  --   --   --     ------------------------------------------------------------------------------------------------------------------ No results for input(s): CHOL, HDL, LDLCALC, TRIG, CHOLHDL, LDLDIRECT in the last 72 hours.  No results found for: HGBA1C ------------------------------------------------------------------------------------------------------------------ No results for input(s): TSH, T4TOTAL, T3FREE, THYROIDAB in the last 72 hours.  Invalid input(s): FREET3  Cardiac Enzymes No results for input(s): CKMB, TROPONINI, MYOGLOBIN in the  last 168 hours.  Invalid input(s): CK ------------------------------------------------------------------------------------------------------------------ No results found for: BNP  Micro Results Recent Results (from the past 240 hour(s))  Blood Culture (routine x 2)     Status: None (Preliminary result)   Collection Time: 01/27/20  9:20 AM   Specimen: BLOOD  Result Value Ref Range Status   Specimen Description BLOOD BLOOD RIGHT HAND  Final   Special Requests   Final    BOTTLES DRAWN AEROBIC AND ANAEROBIC Blood Culture adequate volume   Culture   Final    NO GROWTH 3 DAYS Performed at [facility], [address], [place], [place] [address]    Report Status PENDING  Incomplete  Respiratory Panel by RT PCR (Flu A&B, Covid) - Nasopharyngeal Swab     Status: Abnormal   Collection Time: 01/27/20  9:24 AM   Specimen: Nasopharyngeal Swab; Nasopharyngeal(NP) swabs in vial transport medium  Result Value Ref Range Status   SARS Coronavirus 2 by RT PCR POSITIVE (A) NEGATIVE Final    Comment: emailed L. Berdik RN 11:10 01/27/20 ([email or web address]) (NOTE) SARS-CoV-2 target nucleic acids are DETECTED.  SARS-CoV-2 RNA is generally detectable in upper respiratory specimens  during the acute phase of infection. Positive results are indicative of the presence of the identified virus, but do not rule out bacterial infection or co-infection with other pathogens not detected by the test. Clinical correlation with patient history and other diagnostic information is necessary to determine patient infection status. The expected result is Negative.  Fact Sheet for Patients:  https://www.moore.com/  Fact Sheet for Healthcare Providers: https://www.young.biz/  This test is not yet approved or cleared by the [place] FDA and  has been authorized for detection and/or diagnosis of SARS-CoV-2 by FDA under an Emergency Use Authorization (EUA).  This EUA will remain  in effect (meaning this test can be used) for the duration of  the COVID-19  declaration under Section 564(b)(1) of the Act, 21 U.S.C. section 360bbb-3(b)(1), unless the authorization is terminated or revoked sooner.      Influenza A by PCR NEGATIVE NEGATIVE Final   Influenza B by PCR NEGATIVE NEGATIVE Final    Comment: (NOTE) The Xpert Xpress SARS-CoV-2/FLU/RSV assay is intended as an aid in  the diagnosis of influenza from Nasopharyngeal swab specimens and  should not be used as a sole basis for treatment. Nasal washings and  aspirates are unacceptable for Xpert Xpress SARS-CoV-2/FLU/RSV  testing.  Fact Sheet for Patients: https://www.moore.com/  Fact Sheet for Healthcare Providers: https://www.young.biz/  This test is not yet approved or cleared by the [place] FDA and  has been authorized for detection and/or diagnosis of SARS-CoV-2 by  FDA under an Emergency Use Authorization (EUA). This EUA will remain  in effect (meaning this test can be used) for the duration of the  Covid-19 declaration under Section 564(b)(1) of the Act, 21  U.S.C. section 360bbb-3(b)(1), unless the authorization is  terminated or revoked. Performed at [facility], [address] [place], [place] [address]   Blood Culture (routine x 2)     Status: None (Preliminary result)   Collection Time: 01/27/20  9:24 AM   Specimen: BLOOD  Result Value Ref Range Status   Specimen Description BLOOD LEFT ANTECUBITAL  Final   Special Requests   Final    BOTTLES DRAWN AEROBIC AND ANAEROBIC Blood Culture adequate volume   Culture   Final    NO GROWTH 3 DAYS Performed at [facility], [address] [place], [place] [address]    Report Status PENDING  Incomplete    Radiology Reports CT Angio Chest PE W/Cm &/Or Wo Cm  Result Date: 01/27/2020 CLINICAL DATA:  Difficulty breathing. EXAM: CT ANGIOGRAPHY CHEST WITH CONTRAST TECHNIQUE: Multidetector CT  imaging of the chest was performed using the standard protocol during bolus administration of intravenous contrast. Multiplanar CT image reconstructions and MIPs were obtained to evaluate the vascular anatomy. CONTRAST:  75mL OMNIPAQUE IOHEXOL 350 MG/ML SOLN COMPARISON:  None. FINDINGS: Cardiovascular: Satisfactory opacification of the pulmonary arteries to the segmental level. No evidence of pulmonary embolism. Normal heart size. No pericardial effusion. Mediastinum/Nodes: No enlarged mediastinal, hilar, or axillary lymph nodes. Thyroid gland, trachea, and esophagus demonstrate no significant findings. Lungs/Pleura: No pneumothorax or pleural effusion is noted. Patchy airspace opacities are noted diffusely throughout both lungs most consistent with multifocal pneumonia due to COVID-19. Upper Abdomen: Hepatic steatosis. Musculoskeletal: No chest wall abnormality. No acute or significant osseous findings. Review of the MIP images confirms the above findings. IMPRESSION: 1. No definite evidence of pulmonary embolus. 2. Patchy airspace opacities are noted diffusely throughout both lungs most consistent with multifocal pneumonia due to COVID-19. 3. Hepatic steatosis. Electronically Signed   By: Lupita Raider M.D.   On: 01/27/2020 15:08   DG Chest Port 1 View  Result Date: 01/27/2020 CLINICAL DATA:  Increased work of breathing and decreased O2 saturation in the setting of COVID exposure EXAM: PORTABLE CHEST 1 VIEW COMPARISON:  June 21, 2012 FINDINGS: Lung volumes are low. Heart size accentuated by portable technique and low lung volumes likely stable compared to prior study. Hilar structures are obscured by scattered areas of confluent airspace disease in the RIGHT mid chest and lower chest particularly but with scattered nodular densities throughout the lung base on the RIGHT in the mid chest on the LEFT. No sign of pleural effusion. On limited assessment no acute skeletal process. IMPRESSION: Low lung volumes  with scattered areas of confluent airspace disease and scattered nodular densities as described. Findings worrisome for multifocal pneumonia in this patient with history of exposure to COVID-19. Follow-up is suggested to ensure resolution. Electronically Signed   By: Donzetta Kohut M.D.   On: 01/27/2020 09:58   VAS [place] LOWER EXTREMITY VENOUS (DVT)  Result Date: 01/28/2020  Lower Venous DVT Study Indications: Elevated D dimer. Other Indications: Covid +. Performing Technologist: Levada Schilling RDMS, RVT  Examination Guidelines: A complete evaluation includes B-mode imaging, spectral Doppler, color Doppler, and power Doppler as needed of all accessible portions of each vessel. Bilateral testing is considered an integral part of a complete examination. Limited examinations for reoccurring indications may be performed as noted. The reflux portion of the exam is performed with the patient in reverse Trendelenburg.  +---------+---------------+---------+-----------+----------+--------------+ RIGHT    CompressibilityPhasicitySpontaneityPropertiesThrombus Aging +---------+---------------+---------+-----------+----------+--------------+ CFV      Full           Yes      Yes                                 +---------+---------------+---------+-----------+----------+--------------+  SFJ      Full                                                        +---------+---------------+---------+-----------+----------+--------------+ FV Prox  Full                                                        +---------+---------------+---------+-----------+----------+--------------+ FV Mid   Full                                                        +---------+---------------+---------+-----------+----------+--------------+ FV DistalFull                                                        +---------+---------------+---------+-----------+----------+--------------+ PFV      Full                                                         +---------+---------------+---------+-----------+----------+--------------+ POP      Full           Yes      Yes                                 +---------+---------------+---------+-----------+----------+--------------+ PTV      Full                                                        +---------+---------------+---------+-----------+----------+--------------+ PERO     Full                                                        +---------+---------------+---------+-----------+----------+--------------+ GSV      Full                                                        +---------+---------------+---------+-----------+----------+--------------+   +---------+---------------+---------+-----------+----------+--------------+ LEFT     CompressibilityPhasicitySpontaneityPropertiesThrombus Aging +---------+---------------+---------+-----------+----------+--------------+ CFV      Full           Yes      Yes                                 +---------+---------------+---------+-----------+----------+--------------+  SFJ      Full                                                        +---------+---------------+---------+-----------+----------+--------------+ FV Prox  Full                                                        +---------+---------------+---------+-----------+----------+--------------+ FV Mid   Full                                                        +---------+---------------+---------+-----------+----------+--------------+ FV DistalFull                                                        +---------+---------------+---------+-----------+----------+--------------+ PFV      Full                                                        +---------+---------------+---------+-----------+----------+--------------+ POP      Full           Yes      Yes                                  +---------+---------------+---------+-----------+----------+--------------+ PTV      Full                                                        +---------+---------------+---------+-----------+----------+--------------+ PERO     Full                                                        +---------+---------------+---------+-----------+----------+--------------+ GSV      Full                                                        +---------+---------------+---------+-----------+----------+--------------+     Summary: RIGHT: - There is no evidence of deep vein thrombosis in the lower extremity.  - No cystic structure found in the popliteal fossa.  LEFT: - There is no evidence of deep vein thrombosis in the lower extremity.  - No cystic structure  found in the popliteal fossa.  *See table(s) above for measurements and observations.    Preliminary

## 2020-01-30 NOTE — Plan of Care (Signed)

## 2020-01-31 DIAGNOSIS — U071 COVID-19: Secondary | ICD-10-CM | POA: Diagnosis not present

## 2020-01-31 DIAGNOSIS — E669 Obesity, unspecified: Secondary | ICD-10-CM | POA: Diagnosis not present

## 2020-01-31 DIAGNOSIS — J9601 Acute respiratory failure with hypoxia: Secondary | ICD-10-CM | POA: Diagnosis not present

## 2020-01-31 DIAGNOSIS — K760 Fatty (change of) liver, not elsewhere classified: Secondary | ICD-10-CM | POA: Diagnosis not present

## 2020-01-31 LAB — CBC WITH DIFFERENTIAL/PLATELET
Abs Immature Granulocytes: 0.95 10*3/uL — ABNORMAL HIGH (ref 0.00–0.07)
Basophils Absolute: 0 10*3/uL (ref 0.0–0.1)
Basophils Relative: 0 %
Eosinophils Absolute: 0 10*3/uL (ref 0.0–0.5)
Eosinophils Relative: 0 %
HCT: 41 % (ref 39.0–52.0)
Hemoglobin: 13 g/dL (ref 13.0–17.0)
Immature Granulocytes: 6 %
Lymphocytes Relative: 8 %
Lymphs Abs: 1.4 10*3/uL (ref 0.7–4.0)
MCH: 28.7 pg (ref 26.0–34.0)
MCHC: 31.7 g/dL (ref 30.0–36.0)
MCV: 90.5 fL (ref 80.0–100.0)
Monocytes Absolute: 1.1 10*3/uL — ABNORMAL HIGH (ref 0.1–1.0)
Monocytes Relative: 6 %
Neutro Abs: 13.7 10*3/uL — ABNORMAL HIGH (ref 1.7–7.7)
Neutrophils Relative %: 80 %
Platelets: 537 10*3/uL — ABNORMAL HIGH (ref 150–400)
RBC: 4.53 MIL/uL (ref 4.22–5.81)
RDW: 12.6 % (ref 11.5–15.5)
WBC: 17.6 10*3/uL — ABNORMAL HIGH (ref 4.0–10.5)
nRBC: 0.2 % (ref 0.0–0.2)

## 2020-01-31 LAB — COMPREHENSIVE METABOLIC PANEL
ALT: 199 U/L — ABNORMAL HIGH (ref 0–44)
AST: 123 U/L — ABNORMAL HIGH (ref 15–41)
Albumin: 2.9 g/dL — ABNORMAL LOW (ref 3.5–5.0)
Alkaline Phosphatase: 94 U/L (ref 38–126)
Anion gap: 14 (ref 5–15)
BUN: 24 mg/dL — ABNORMAL HIGH (ref 6–20)
CO2: 26 mmol/L (ref 22–32)
Calcium: 8.6 mg/dL — ABNORMAL LOW (ref 8.9–10.3)
Chloride: 101 mmol/L (ref 98–111)
Creatinine, Ser: 0.98 mg/dL (ref 0.61–1.24)
GFR, Estimated: >60 mL/min (ref >60)
Glucose, Bld: 157 mg/dL — ABNORMAL HIGH (ref 70–99)
Potassium: 4.2 mmol/L (ref 3.5–5.1)
Sodium: 141 mmol/L (ref 135–145)
Total Bilirubin: 0.7 mg/dL (ref 0.3–1.2)
Total Protein: 5.8 g/dL — ABNORMAL LOW (ref 6.5–8.1)

## 2020-01-31 LAB — D-DIMER, QUANTITATIVE: D-Dimer, Quant: 2.24 ug/mL-FEU — ABNORMAL HIGH (ref 0.00–0.50)

## 2020-01-31 LAB — C-REACTIVE PROTEIN: CRP: 2.7 mg/dL — ABNORMAL HIGH (ref <1.0)

## 2020-01-31 MED ORDER — ALBUTEROL SULFATE HFA 108 (90 BASE) MCG/ACT IN AERS: 2.0000 | INHALATION_SPRAY | Freq: Four times a day (QID) | RESPIRATORY_TRACT | 0 refills | Status: AC | PRN

## 2020-01-31 MED ORDER — BENZONATATE 100 MG PO CAPS: 100.0000 mg | ORAL_CAPSULE | Freq: Four times a day (QID) | ORAL | 0 refills | Status: AC | PRN

## 2020-01-31 MED ORDER — PREDNISONE 10 MG PO TABS: ORAL_TABLET | ORAL | 0 refills | Status: AC

## 2020-01-31 NOTE — Discharge Summary (Addendum)
PATIENT DETAILS Name: Eric Lutz Age: 44 y.o. Sex: male Date of Birth: 01/24/1976 MRN: [id number]. Admitting Physician: Clydie Braun, MD [id number], Jonetta Speak, MD  Admit Date: 01/27/2020 Discharge date: 01/31/2020  Recommendations for Outpatient Follow-up:  1. Follow up with PCP in 1-2 weeks 2. Please obtain CMP/CBC in one week 3. Repeat Chest Xray in 4-6 week  Admitted From:  Home  Disposition: Home   Home Health: No  Equipment/Devices: None  Discharge Condition: Stable  CODE STATUS: Partial Code-ok to Intubate-but NO CPR/defibrillation/ACLS meds  Diet recommendation:  Diet Order            Diet general           Diet regular Room service appropriate? Yes; Fluid consistency: Thin  Diet effective now                  Brief Summary: Eric Lutz a 44 y.o.male no significant past medical history, diagnosed with COVID-19 (unvaccinated) 12 days prior to this hospitalization-presented with shortness of breath-found to have severe acute hypoxic respiratory failure due to COVID-19 pneumonia.  Admitted to the hospitalist service for further evaluation and treatment.  Brief Hospital Course: Acute Hypoxic Resp. Failure due to COVID-19 pneumonia: Had severe parenchymal injury on presentation-required up to 10 L of HFNC at one-point.  Treated with steroids/remdesivir and 1 dose of Actemra-with unremarkable clinical improvement.  Titrated down to room air today-he will continue with tapering steroids on discharge.  He will finish Remdesivir on 11/26.  RN to assess whether patient requires home O2 (may require with ambulation)-if he does-an addendum will be made to this discharge summary.    Patient aware that he needs to follow-up with his PCP for repeat LFTs-and also to reassess whether he still requires home O2.  Patient was counseled regarding the importance of vaccination-however he appears very hesitant about proceeding with vaccination-once he recovers from  this acute infection.  Addendum: Appears that patient requires 2 L of oxygen with ambulation-at rest he is stable on room air.  Home O2 ordered.   COVID-19 Labs:  Recent Labs    01/29/20 0028 01/30/20 0020 01/31/20 0039  DDIMER 3.04* 2.23* 2.24*  CRP 10.2* 5.2* 2.7*    Lab Results  Component Value Date   SARSCOV2NAA POSITIVE (A) 01/27/2020     Transaminitis: Secondary to COVID-19-and possible from Remdesivir use as well.  Suspect will improve over the next 2 days-have asked patient to make an appointment with PCP for blood work in approximately a week or so.  Patient counseled regarding the importance of avoiding alcohol, Tylenol and other hepatotoxic medications.  Minimally elevated D-dimer: Secondary to COVID-19 related inflammation-hypoxia has improved-lower extremity Doppler CTA chest negative for VTE  History of hepatic steatosis: Stable for outpatient follow-up with PCP.  See above regarding recommendations to repeat LFTs in 1 week.   Obesity: BMI 30.17 kg/m, follow with PCP for weight loss.  Discharge Diagnoses:  Principal Problem:   Pneumonia due to COVID-19 virus Active Problems:   Sepsis (HCC)   Acute respiratory failure with hypoxia (HCC)   Fatty liver disease, nonalcoholic   Obesity (BMI 30.0-34.9)   Protein calorie malnutrition (HCC)   DNI (do not intubate)   Discharge Instructions:    Person Under Monitoring Name: Eric Lutz  Location: [address] [place] [address]   Infection Prevention Recommendations for Individuals Confirmed to have, or Being Evaluated for, 2019 Novel Coronavirus (COVID-19) Infection Who Receive Care at Home  Individuals who are  confirmed to have, or are being evaluated for, COVID-19 should follow the prevention steps below until a healthcare provider or local or state health department says they can return to normal activities.  Stay home except to get medical care You should restrict activities outside  your home, except for getting medical care. Do not go to work, school, or public areas, and do not use public transportation or taxis.  Call ahead before visiting your doctor Before your medical appointment, call the healthcare provider and tell them that you have, or are being evaluated for, COVID-19 infection. This will help the healthcare provider's office take steps to keep other people from getting infected. Ask your healthcare provider to call the local or state health department.  Monitor your symptoms Seek prompt medical attention if your illness is worsening (e.g., difficulty breathing). Before going to your medical appointment, call the healthcare provider and tell them that you have, or are being evaluated for, COVID-19 infection. Ask your healthcare provider to call the local or state health department.  Wear a facemask You should wear a facemask that covers your nose and mouth when you are in the same room with other people and when you visit a healthcare provider. People who live with or visit you should also wear a facemask while they are in the same room with you.  Separate yourself from other people in your home As much as possible, you should stay in a different room from other people in your home. Also, you should use a separate bathroom, if available.  Avoid sharing household items You should not share dishes, drinking glasses, cups, eating utensils, towels, bedding, or other items with other people in your home. After using these items, you should wash them thoroughly with soap and water.  Cover your coughs and sneezes Cover your mouth and nose with a tissue when you cough or sneeze, or you can cough or sneeze into your sleeve. Throw used tissues in a lined trash can, and immediately wash your hands with soap and water for at least 20 seconds or use an alcohol-based hand rub.  Wash your [employer] your hands often and thoroughly with soap and water for at least 20  seconds. You can use an alcohol-based hand sanitizer if soap and water are not available and if your hands are not visibly dirty. Avoid touching your eyes, nose, and mouth with unwashed hands.   Prevention Steps for Caregivers and Household Members of Individuals Confirmed to have, or Being Evaluated for, COVID-19 Infection Being Cared for in the Home  If you live with, or provide care at home for, a person confirmed to have, or being evaluated for, COVID-19 infection please follow these guidelines to prevent infection:  Follow healthcare provider's instructions Make sure that you understand and can help the patient follow any healthcare provider instructions for all care.  Provide for the patient's basic needs You should help the patient with basic needs in the home and provide support for getting groceries, prescriptions, and other personal needs.  Monitor the patient's symptoms If they are getting sicker, call his or her medical provider and tell them that the patient has, or is being evaluated for, COVID-19 infection. This will help the healthcare provider's office take steps to keep other people from getting infected. Ask the healthcare provider to call the local or state health department.  Limit the number of people who have contact with the patient  If possible, have only one caregiver for the patient.  Other household members should stay in another home or place of residence. If this is not possible, they should stay  in another room, or be separated from the patient as much as possible. Use a separate bathroom, if available.  Restrict visitors who do not have an essential need to be in the home.  Keep older adults, very young children, and other sick people away from the patient Keep older adults, very young children, and those who have compromised immune systems or chronic health conditions away from the patient. This includes people with chronic heart, lung, or kidney  conditions, diabetes, and cancer.  Ensure good ventilation Make sure that shared spaces in the home have good air flow, such as from an air conditioner or an opened window, weather permitting.  Wash your hands often  Wash your hands often and thoroughly with soap and water for at least 20 seconds. You can use an alcohol based hand sanitizer if soap and water are not available and if your hands are not visibly dirty.  Avoid touching your eyes, nose, and mouth with unwashed hands.  Use disposable paper towels to dry your hands. If not available, use dedicated cloth towels and replace them when they become wet.  Wear a facemask and gloves  Wear a disposable facemask at all times in the room and gloves when you touch or have contact with the patient's blood, body fluids, and/or secretions or excretions, such as sweat, saliva, sputum, nasal mucus, vomit, urine, or feces.  Ensure the mask fits over your nose and mouth tightly, and do not touch it during use.  Throw out disposable facemasks and gloves after using them. Do not reuse.  Wash your hands immediately after removing your facemask and gloves.  If your personal clothing becomes contaminated, carefully remove clothing and launder. Wash your hands after handling contaminated clothing.  Place all used disposable facemasks, gloves, and other waste in a lined container before disposing them with other household waste.  Remove gloves and wash your hands immediately after handling these items.  Do not share dishes, glasses, or other household items with the patient  Avoid sharing household items. You should not share dishes, drinking glasses, cups, eating utensils, towels, bedding, or other items with a patient who is confirmed to have, or being evaluated for, COVID-19 infection.  After the person uses these items, you should wash them thoroughly with soap and water.  Wash laundry thoroughly  Immediately remove and wash clothes or  bedding that have blood, body fluids, and/or secretions or excretions, such as sweat, saliva, sputum, nasal mucus, vomit, urine, or feces, on them.  Wear gloves when handling laundry from the patient.  Read and follow directions on labels of laundry or clothing items and detergent. In general, wash and dry with the warmest temperatures recommended on the label.  Clean all areas the individual has used often  Clean all touchable surfaces, such as counters, tabletops, doorknobs, bathroom fixtures, toilets, phones, keyboards, tablets, and bedside tables, every day. Also, clean any surfaces that may have blood, body fluids, and/or secretions or excretions on them.  Wear gloves when cleaning surfaces the patient has come in contact with.  Use a diluted bleach solution (e.g., dilute bleach with 1 part bleach and 10 parts water) or a household disinfectant with a label that says EPA-registered for coronaviruses. To make a bleach solution at home, add 1 tablespoon of bleach to 1 quart (4 cups) of water. For a larger supply, add  cup of  bleach to 1 gallon (16 cups) of water.  Read labels of cleaning products and follow recommendations provided on product labels. Labels contain instructions for safe and effective use of the cleaning product including precautions you should take when applying the product, such as wearing gloves or eye protection and making sure you have good ventilation during use of the product.  Remove gloves and wash hands immediately after cleaning.  Monitor yourself for signs and symptoms of illness Caregivers and household members are considered close contacts, should monitor their health, and will be asked to limit movement outside of the home to the extent possible. Follow the monitoring steps for close contacts listed on the symptom monitoring form.   ? If you have additional questions, contact your local health department or call the epidemiologist on call at  [phone] (available 24/7). ? This guidance is subject to change. For the most up-to-date guidance from [facility], please refer to their website: [employer]    Activity:  As tolerated    Discharge Instructions    Call MD for:  difficulty breathing, headache or visual disturbances   Complete by: As directed    Diet general   Complete by: As directed    Discharge instructions   Complete by: As directed    Follow with Primary MD  Paulina Fusi, MD in 1 week  Please get a complete blood count and chemistry panel checked by your Primary MD at your next visit, and again as instructed by your Primary MD.  Please ask your primary care practitioner to repeat two-view chest x-ray in 4 to 6 weeks.  Get Medicines reviewed and adjusted: Please take all your medications with you for your next visit with your Primary MD  Laboratory/radiological data: Please request your Primary MD to go over all hospital tests and procedure/radiological results at the follow up, please ask your Primary MD to get all Hospital records sent to his/her office.  In some cases, they will be blood work, cultures and biopsy results pending at the time of your discharge. Please request that your primary care M.D. follows up on these results.  Also Note the following: If you experience worsening of your admission symptoms, develop shortness of breath, life threatening emergency, suicidal or homicidal thoughts you must seek medical attention immediately by calling 911 or calling your MD immediately  if symptoms less severe.  You must read complete instructions/literature along with all the possible adverse reactions/side effects for all the Medicines you take and that have been prescribed to you. Take any new Medicines after you have completely understood and accpet all the possible adverse reactions/side effects.   Do not drive when taking Pain medications  or sleeping medications (Benzodaizepines)  Do not take more than prescribed Pain, Sleep and Anxiety Medications. It is not advisable to combine anxiety,sleep and pain medications without talking with your primary care practitioner  Special Instructions: If you have smoked or chewed Tobacco  in the last 2 yrs please stop smoking, stop any regular Alcohol  and or any Recreational drug use.  Wear Seat belts while driving.  Please note: You were cared for by a hospitalist during your hospital stay. Once you are discharged, your primary care physician will handle any further medical issues. Please note that NO REFILLS for any discharge medications will be authorized once you are discharged, as it is imperative that you return to your primary care physician (or establish a relationship with a primary care physician if you do not have one)  for your post hospital discharge needs so that they can reassess your need for medications and monitor your lab values.   1.)  21 days of isolation from the day of your first positive Covid test.    2.)  Your liver enzymes are elevated-please ask your primary care practitioner to repeat liver enzymes within 1 week.  3.)  It is highly recommended that you stop all over-the-counter supplements-until your liver enzymes back down to your normal range.  Avoid excessive Tylenol-avoid alcohol use.  Please talk with your primary care practitioner before resuming over-the-counter supplements or before starting any prescription medications.  4.)  If you develop worsening shortness of breath-please seek immediate medical attention.   Increase activity slowly   Complete by: As directed      Allergies as of 01/31/2020      Reactions   Codeine Nausea And Vomiting      Medication List    STOP taking these medications   acetaminophen 500 MG tablet Commonly known as: TYLENOL   APPLE CIDER VINEGAR PO   ascorbic acid 500 MG tablet Commonly known as: VITAMIN C    budesonide 0.5 MG/2ML nebulizer solution Commonly known as: PULMICORT   chlorpheniramine-HYDROcodone 10-8 MG/5ML Suer Commonly known as: TUSSIONEX   QUERCETIN PO   SUPER ENZYMES PO   zinc sulfate 220 (50 Zn) MG capsule     TAKE these medications   albuterol 108 (90 Base) MCG/ACT inhaler Commonly known as: VENTOLIN HFA Inhale 2 puffs into the lungs every 6 (six) hours as needed for wheezing or shortness of breath.   benzonatate 100 MG capsule Commonly known as: Tessalon Perles Take 1 capsule (100 mg total) by mouth every 6 (six) hours as needed for cough.   famotidine 20 MG tablet Commonly known as: PEPCID Take 20 mg by mouth 2 (two) times daily.   ibuprofen 200 MG tablet Commonly known as: ADVIL Take 400 mg by mouth every 6 (six) hours as needed for fever or moderate pain.   predniSONE 10 MG tablet Commonly known as: DELTASONE Take 40 mg daily for 2 days, 30 mg daily for 2 days, 20 mg daily for 2 days,10 mg daily for 1 days, then stop   promethazine 25 MG tablet Commonly known as: PHENERGAN 25 mg every 6 (six) hours as needed for nausea or vomiting.   psyllium 58.6 % powder Commonly known as: METAMUCIL Take 1 Scoop by mouth daily.   Vitamin D3 50 MCG (2000 UT) Tabs Take 2,000 Units by mouth daily.       Follow-up Information    Paulina FusiSchultz, Douglas E, MD. Schedule an appointment as soon as possible for a visit in 1 week(s).   Specialty: Internal Medicine Contact information: [address] [place] [address] [phone]              Allergies  Allergen Reactions  . Codeine Nausea And Vomiting     Other Procedures/Studies: CT Angio Chest PE W/Cm &/Or Wo Cm  Result Date: 01/27/2020 CLINICAL DATA:  Difficulty breathing. EXAM: CT ANGIOGRAPHY CHEST WITH CONTRAST TECHNIQUE: Multidetector CT imaging of the chest was performed using the standard protocol during bolus administration of intravenous contrast. Multiplanar CT image  reconstructions and MIPs were obtained to evaluate the vascular anatomy. CONTRAST:  75mL OMNIPAQUE IOHEXOL 350 MG/ML SOLN COMPARISON:  None. FINDINGS: Cardiovascular: Satisfactory opacification of the pulmonary arteries to the segmental level. No evidence of pulmonary embolism. Normal heart size. No pericardial effusion. Mediastinum/Nodes: No enlarged mediastinal,  hilar, or axillary lymph nodes. Thyroid gland, trachea, and esophagus demonstrate no significant findings. Lungs/Pleura: No pneumothorax or pleural effusion is noted. Patchy airspace opacities are noted diffusely throughout both lungs most consistent with multifocal pneumonia due to COVID-19. Upper Abdomen: Hepatic steatosis. Musculoskeletal: No chest wall abnormality. No acute or significant osseous findings. Review of the MIP images confirms the above findings. IMPRESSION: 1. No definite evidence of pulmonary embolus. 2. Patchy airspace opacities are noted diffusely throughout both lungs most consistent with multifocal pneumonia due to COVID-19. 3. Hepatic steatosis. Electronically Signed   By: Lupita Raider M.D.   On: 01/27/2020 15:08   DG Chest Port 1 View  Result Date: 01/27/2020 CLINICAL DATA:  Increased work of breathing and decreased O2 saturation in the setting of COVID exposure EXAM: PORTABLE CHEST 1 VIEW COMPARISON:  June 21, 2012 FINDINGS: Lung volumes are low. Heart size accentuated by portable technique and low lung volumes likely stable compared to prior study. Hilar structures are obscured by scattered areas of confluent airspace disease in the RIGHT mid chest and lower chest particularly but with scattered nodular densities throughout the lung base on the RIGHT in the mid chest on the LEFT. No sign of pleural effusion. On limited assessment no acute skeletal process. IMPRESSION: Low lung volumes with scattered areas of confluent airspace disease and scattered nodular densities as described. Findings worrisome for multifocal  pneumonia in this patient with history of exposure to COVID-19. Follow-up is suggested to ensure resolution. Electronically Signed   By: Donzetta Kohut M.D.   On: 01/27/2020 09:58   VAS [place] LOWER EXTREMITY VENOUS (DVT)  Result Date: 01/28/2020  Lower Venous DVT Study Indications: Elevated D dimer. Other Indications: Covid +. Performing Technologist: Levada Schilling RDMS, RVT  Examination Guidelines: A complete evaluation includes B-mode imaging, spectral Doppler, color Doppler, and power Doppler as needed of all accessible portions of each vessel. Bilateral testing is considered an integral part of a complete examination. Limited examinations for reoccurring indications may be performed as noted. The reflux portion of the exam is performed with the patient in reverse Trendelenburg.  +---------+---------------+---------+-----------+----------+--------------+ RIGHT    CompressibilityPhasicitySpontaneityPropertiesThrombus Aging +---------+---------------+---------+-----------+----------+--------------+ CFV      Full           Yes      Yes                                 +---------+---------------+---------+-----------+----------+--------------+ SFJ      Full                                                        +---------+---------------+---------+-----------+----------+--------------+ FV Prox  Full                                                        +---------+---------------+---------+-----------+----------+--------------+ FV Mid   Full                                                        +---------+---------------+---------+-----------+----------+--------------+  FV DistalFull                                                        +---------+---------------+---------+-----------+----------+--------------+ PFV      Full                                                        +---------+---------------+---------+-----------+----------+--------------+ POP       Full           Yes      Yes                                 +---------+---------------+---------+-----------+----------+--------------+ PTV      Full                                                        +---------+---------------+---------+-----------+----------+--------------+ PERO     Full                                                        +---------+---------------+---------+-----------+----------+--------------+ GSV      Full                                                        +---------+---------------+---------+-----------+----------+--------------+   +---------+---------------+---------+-----------+----------+--------------+ LEFT     CompressibilityPhasicitySpontaneityPropertiesThrombus Aging +---------+---------------+---------+-----------+----------+--------------+ CFV      Full           Yes      Yes                                 +---------+---------------+---------+-----------+----------+--------------+ SFJ      Full                                                        +---------+---------------+---------+-----------+----------+--------------+ FV Prox  Full                                                        +---------+---------------+---------+-----------+----------+--------------+ FV Mid   Full                                                        +---------+---------------+---------+-----------+----------+--------------+  FV DistalFull                                                        +---------+---------------+---------+-----------+----------+--------------+ PFV      Full                                                        +---------+---------------+---------+-----------+----------+--------------+ POP      Full           Yes      Yes                                 +---------+---------------+---------+-----------+----------+--------------+ PTV      Full                                                         +---------+---------------+---------+-----------+----------+--------------+ PERO     Full                                                        +---------+---------------+---------+-----------+----------+--------------+ GSV      Full                                                        +---------+---------------+---------+-----------+----------+--------------+     Summary: RIGHT: - There is no evidence of deep vein thrombosis in the lower extremity.  - No cystic structure found in the popliteal fossa.  LEFT: - There is no evidence of deep vein thrombosis in the lower extremity.  - No cystic structure found in the popliteal fossa.  *See table(s) above for measurements and observations.    Preliminary      TODAY-DAY OF DISCHARGE:  Subjective:   Eric Lutz today has no headache,no chest abdominal pain,no new weakness tingling or numbness, feels much better wants to go home today.   Objective:   Blood pressure 117/84, pulse 64, temperature 98.2 F (36.8 C), temperature source Oral, resp. rate 14, height  (1.88 m), weight 103.2 kg, SpO2 96 %.  Intake/Output Summary (Last 24 hours) at 01/31/2020 1113 Last data filed at 01/31/2020 0045 Gross per 24 hour  Intake 120 ml  Output 1250 ml  Net -1130 ml   Filed Weights   01/27/20 0911 01/27/20 1940  Weight: 106.6 kg 103.2 kg    Exam: Awake Alert, Oriented *3, No new F.N deficits, Normal affect [place].AT,PERRAL Supple Neck,No JVD, No cervical lymphadenopathy appriciated.  Symmetrical Chest wall movement, Good air movement bilaterally, CTAB RRR,No Gallops,Rubs or new Murmurs, No Parasternal Heave +ve B.Sounds, Abd Soft, Non tender, No organomegaly appriciated, No rebound -guarding or rigidity.  No Cyanosis, Clubbing or edema, No new Rash or bruise   PERTINENT RADIOLOGIC STUDIES: CT Angio Chest PE W/Cm &/Or Wo Cm  Result Date: 01/27/2020 CLINICAL DATA:  Difficulty breathing. EXAM: CT ANGIOGRAPHY CHEST  WITH CONTRAST TECHNIQUE: Multidetector CT imaging of the chest was performed using the standard protocol during bolus administration of intravenous contrast. Multiplanar CT image reconstructions and MIPs were obtained to evaluate the vascular anatomy. CONTRAST:  58mL OMNIPAQUE IOHEXOL 350 MG/ML SOLN COMPARISON:  None. FINDINGS: Cardiovascular: Satisfactory opacification of the pulmonary arteries to the segmental level. No evidence of pulmonary embolism. Normal heart size. No pericardial effusion. Mediastinum/Nodes: No enlarged mediastinal, hilar, or axillary lymph nodes. Thyroid gland, trachea, and esophagus demonstrate no significant findings. Lungs/Pleura: No pneumothorax or pleural effusion is noted. Patchy airspace opacities are noted diffusely throughout both lungs most consistent with multifocal pneumonia due to COVID-19. Upper Abdomen: Hepatic steatosis. Musculoskeletal: No chest wall abnormality. No acute or significant osseous findings. Review of the MIP images confirms the above findings. IMPRESSION: 1. No definite evidence of pulmonary embolus. 2. Patchy airspace opacities are noted diffusely throughout both lungs most consistent with multifocal pneumonia due to COVID-19. 3. Hepatic steatosis. Electronically Signed   By: Lupita Raider M.D.   On: 01/27/2020 15:08   DG Chest Port 1 View  Result Date: 01/27/2020 CLINICAL DATA:  Increased work of breathing and decreased O2 saturation in the setting of COVID exposure EXAM: PORTABLE CHEST 1 VIEW COMPARISON:  June 21, 2012 FINDINGS: Lung volumes are low. Heart size accentuated by portable technique and low lung volumes likely stable compared to prior study. Hilar structures are obscured by scattered areas of confluent airspace disease in the RIGHT mid chest and lower chest particularly but with scattered nodular densities throughout the lung base on the RIGHT in the mid chest on the LEFT. No sign of pleural effusion. On limited assessment no acute  skeletal process. IMPRESSION: Low lung volumes with scattered areas of confluent airspace disease and scattered nodular densities as described. Findings worrisome for multifocal pneumonia in this patient with history of exposure to COVID-19. Follow-up is suggested to ensure resolution. Electronically Signed   By: Donzetta Kohut M.D.   On: 01/27/2020 09:58   VAS [place] LOWER EXTREMITY VENOUS (DVT)  Result Date: 01/28/2020  Lower Venous DVT Study Indications: Elevated D dimer. Other Indications: Covid +. Performing Technologist: Levada Schilling RDMS, RVT  Examination Guidelines: A complete evaluation includes B-mode imaging, spectral Doppler, color Doppler, and power Doppler as needed of all accessible portions of each vessel. Bilateral testing is considered an integral part of a complete examination. Limited examinations for reoccurring indications may be performed as noted. The reflux portion of the exam is performed with the patient in reverse Trendelenburg.  +---------+---------------+---------+-----------+----------+--------------+ RIGHT    CompressibilityPhasicitySpontaneityPropertiesThrombus Aging +---------+---------------+---------+-----------+----------+--------------+ CFV      Full           Yes      Yes                                 +---------+---------------+---------+-----------+----------+--------------+ SFJ      Full                                                        +---------+---------------+---------+-----------+----------+--------------+ FV Prox  Full                                                        +---------+---------------+---------+-----------+----------+--------------+ FV Mid   Full                                                        +---------+---------------+---------+-----------+----------+--------------+ FV DistalFull                                                         +---------+---------------+---------+-----------+----------+--------------+ PFV      Full                                                        +---------+---------------+---------+-----------+----------+--------------+ POP      Full           Yes      Yes                                 +---------+---------------+---------+-----------+----------+--------------+ PTV      Full                                                        +---------+---------------+---------+-----------+----------+--------------+ PERO     Full                                                        +---------+---------------+---------+-----------+----------+--------------+ GSV      Full                                                        +---------+---------------+---------+-----------+----------+--------------+   +---------+---------------+---------+-----------+----------+--------------+ LEFT     CompressibilityPhasicitySpontaneityPropertiesThrombus Aging +---------+---------------+---------+-----------+----------+--------------+ CFV      Full           Yes      Yes                                 +---------+---------------+---------+-----------+----------+--------------+ SFJ      Full                                                        +---------+---------------+---------+-----------+----------+--------------+  FV Prox  Full                                                        +---------+---------------+---------+-----------+----------+--------------+ FV Mid   Full                                                        +---------+---------------+---------+-----------+----------+--------------+ FV DistalFull                                                        +---------+---------------+---------+-----------+----------+--------------+ PFV      Full                                                         +---------+---------------+---------+-----------+----------+--------------+ POP      Full           Yes      Yes                                 +---------+---------------+---------+-----------+----------+--------------+ PTV      Full                                                        +---------+---------------+---------+-----------+----------+--------------+ PERO     Full                                                        +---------+---------------+---------+-----------+----------+--------------+ GSV      Full                                                        +---------+---------------+---------+-----------+----------+--------------+     Summary: RIGHT: - There is no evidence of deep vein thrombosis in the lower extremity.  - No cystic structure found in the popliteal fossa.  LEFT: - There is no evidence of deep vein thrombosis in the lower extremity.  - No cystic structure found in the popliteal fossa.  *See table(s) above for measurements and observations.    Preliminary      PERTINENT LAB RESULTS: CBC: Recent Labs    01/30/20 0020 01/31/20 0039  WBC 17.4* 17.6*  HGB 12.3* 13.0  HCT 37.7* 41.0  PLT 490* 537*   CMET CMP     Component Value Date/Time  NA 141 01/31/2020 0039   K 4.2 01/31/2020 0039   CL 101 01/31/2020 0039   CO2 26 01/31/2020 0039   GLUCOSE 157 (H) 01/31/2020 0039   BUN 24 (H) 01/31/2020 0039   CREATININE 0.98 01/31/2020 0039   CALCIUM 8.6 (L) 01/31/2020 0039   PROT 5.8 (L) 01/31/2020 0039   ALBUMIN 2.9 (L) 01/31/2020 0039   AST 123 (H) 01/31/2020 0039   ALT 199 (H) 01/31/2020 0039   ALKPHOS 94 01/31/2020 0039   BILITOT 0.7 01/31/2020 0039   GFRNONAA >60 01/31/2020 0039   GFRAA 51 (L) 09/27/2017 1241    GFR Estimated Creatinine Clearance: 124.5 mL/min (by C-G formula based on SCr of 0.98 mg/dL). No results for input(s): LIPASE, AMYLASE in the last 72 hours. No results for input(s): CKTOTAL, CKMB, CKMBINDEX, TROPONINI  in the last 72 hours. Invalid input(s): POCBNP Recent Labs    01/30/20 0020 01/31/20 0039  DDIMER 2.23* 2.24*   No results for input(s): HGBA1C in the last 72 hours. No results for input(s): CHOL, HDL, LDLCALC, TRIG, CHOLHDL, LDLDIRECT in the last 72 hours. No results for input(s): TSH, T4TOTAL, T3FREE, THYROIDAB in the last 72 hours.  Invalid input(s): FREET3 No results for input(s): VITAMINB12, FOLATE, FERRITIN, TIBC, IRON, RETICCTPCT in the last 72 hours. Coags: No results for input(s): INR in the last 72 hours.  Invalid input(s): PT Microbiology: Recent Results (from the past 240 hour(s))  Blood Culture (routine x 2)     Status: None (Preliminary result)   Collection Time: 01/27/20  9:20 AM   Specimen: BLOOD  Result Value Ref Range Status   Specimen Description BLOOD BLOOD RIGHT HAND  Final   Special Requests   Final    BOTTLES DRAWN AEROBIC AND ANAEROBIC Blood Culture adequate volume   Culture   Final    NO GROWTH 3 DAYS Performed at [facility], [address], [place], [place] [address]    Report Status PENDING  Incomplete  Respiratory Panel by RT PCR (Flu A&B, Covid) - Nasopharyngeal Swab     Status: Abnormal   Collection Time: 01/27/20  9:24 AM   Specimen: Nasopharyngeal Swab; Nasopharyngeal(NP) swabs in vial transport medium  Result Value Ref Range Status   SARS Coronavirus 2 by RT PCR POSITIVE (A) NEGATIVE Final    Comment: emailed L. Berdik RN 11:10 01/27/20 ([email or web address]) (NOTE) SARS-CoV-2 target nucleic acids are DETECTED.  SARS-CoV-2 RNA is generally detectable in upper respiratory specimens  during the acute phase of infection. Positive results are indicative of the presence of the identified virus, but do not rule out bacterial infection or co-infection with other pathogens not detected by the test. Clinical correlation with patient history and other diagnostic information is necessary to determine patient infection status. The expected result is  Negative.  Fact Sheet for Patients:  https://www.moore.com/  Fact Sheet for Healthcare Providers: https://www.young.biz/  This test is not yet approved or cleared by the [place] FDA and  has been authorized for detection and/or diagnosis of SARS-CoV-2 by FDA under an Emergency Use Authorization (EUA).  This EUA will remain in effect (meaning this test can be used) for the duration of  the COVID-19  declaration under Section 564(b)(1) of the Act, 21 U.S.C. section 360bbb-3(b)(1), unless the authorization is terminated or revoked sooner.      Influenza A by PCR NEGATIVE NEGATIVE Final   Influenza B by PCR NEGATIVE NEGATIVE Final    Comment: (NOTE) The Xpert Xpress SARS-CoV-2/FLU/RSV assay is intended as an  aid in  the diagnosis of influenza from Nasopharyngeal swab specimens and  should not be used as a sole basis for treatment. Nasal washings and  aspirates are unacceptable for Xpert Xpress SARS-CoV-2/FLU/RSV  testing.  Fact Sheet for Patients: https://www.moore.com/  Fact Sheet for Healthcare Providers: https://www.young.biz/  This test is not yet approved or cleared by the [place] FDA and  has been authorized for detection and/or diagnosis of SARS-CoV-2 by  FDA under an Emergency Use Authorization (EUA). This EUA will remain  in effect (meaning this test can be used) for the duration of the  Covid-19 declaration under Section 564(b)(1) of the Act, 21  U.S.C. section 360bbb-3(b)(1), unless the authorization is  terminated or revoked. Performed at [facility], [address], [place], [place] [address]   Blood Culture (routine x 2)     Status: None (Preliminary result)   Collection Time: 01/27/20  9:24 AM   Specimen: BLOOD  Result Value Ref Range Status   Specimen Description BLOOD LEFT ANTECUBITAL  Final   Special Requests   Final    BOTTLES DRAWN AEROBIC AND ANAEROBIC Blood  Culture adequate volume   Culture   Final    NO GROWTH 3 DAYS Performed at [facility], [address] [place], [place] [address]    Report Status PENDING  Incomplete    FURTHER DISCHARGE INSTRUCTIONS:  Get Medicines reviewed and adjusted: Please take all your medications with you for your next visit with your Primary MD  Laboratory/radiological data: Please request your Primary MD to go over all hospital tests and procedure/radiological results at the follow up, please ask your Primary MD to get all Hospital records sent to his/her office.  In some cases, they will be blood work, cultures and biopsy results pending at the time of your discharge. Please request that your primary care M.D. goes through all the records of your hospital data and follows up on these results.  Also Note the following: If you experience worsening of your admission symptoms, develop shortness of breath, life threatening emergency, suicidal or homicidal thoughts you must seek medical attention immediately by calling 911 or calling your MD immediately  if symptoms less severe.  You must read complete instructions/literature along with all the possible adverse reactions/side effects for all the Medicines you take and that have been prescribed to you. Take any new Medicines after you have completely understood and accpet all the possible adverse reactions/side effects.   Do not drive when taking Pain medications or sleeping medications (Benzodaizepines)  Do not take more than prescribed Pain, Sleep and Anxiety Medications. It is not advisable to combine anxiety,sleep and pain medications without talking with your primary care practitioner  Special Instructions: If you have smoked or chewed Tobacco  in the last 2 yrs please stop smoking, stop any regular Alcohol  and or any Recreational drug use.  Wear Seat belts while driving.  Please note: You were cared for by a hospitalist during your hospital stay.  Once you are discharged, your primary care physician will handle any further medical issues. Please note that NO REFILLS for any discharge medications will be authorized once you are discharged, as it is imperative that you return to your primary care physician (or establish a relationship with a primary care physician if you do not have one) for your post hospital discharge needs so that they can reassess your need for medications and monitor your lab values.  Total Time spent coordinating discharge including  counseling, education and face to face time equals 35 minutes.  SignedJeoffrey Massed 01/31/2020 11:13 AM

## 2020-01-31 NOTE — Progress Notes (Signed)
SATURATION QUALIFICATIONS: (This note is used to comply with regulatory documentation for home oxygen)  Patient Saturations on Room Air at Rest = 94%  Patient Saturations on Room Air while Ambulating = 87%  Patient Saturations on 2 Liters of oxygen while Ambulating = 97%  Please briefly explain why patient needs home oxygen: Patient needs O2 while walking to maintain sat above 88%

## 2020-01-31 NOTE — Plan of Care (Signed)
Educatioon and discharge reviewed with patient, questions answered.

## 2020-01-31 NOTE — TOC Initial Note (Signed)
Transition of Care [facility]) - Initial/Assessment Note    Patient Details  Name: Eric Lutz MRN: [id number] Date of Birth: 08/23/1975  Transition of Care [facility]) CM/SW Contact:    Lockie Pares, RN Phone Number: 01/31/2020, 11:35 AM  Clinical Narrative:                 Patient admitted with COVID respiratory failure  On home oxygen. 8-10 liters, spoke to wife, received number for [employer] [place]. [phone] , [phone] has o2 tanks and concentrator at home.  Called all numbers as well as rep, no answer. Patient oxygen qualifiers actually put him at 2LPM. Below hs normal flow. Will be discharged today, wife will pick him up when he is discharged at the [facility]. She requests that he has a note for work  Expected Discharge Plan: Home/Self Care Barriers to Discharge: No Barriers Identified   Patient Goals and CMS Choice        Expected Discharge Plan and Services Expected Discharge Plan: Home/Self Care   Discharge Planning Services: CM Consult   Living arrangements for the past 2 months: Single Family Home Expected Discharge Date: 01/31/20                                    Prior Living Arrangements/Services Living arrangements for the past 2 months: Single Family Home Lives with:: Spouse Patient language and need for interpreter reviewed:: Yes Do you feel safe going back to the place where you live?: Yes      Need for Family Participation in Patient Care: Yes (Comment) Care giver support system in place?: Yes (comment) Current home services: DME (oxygen) Criminal Activity/Legal Involvement Pertinent to Current Situation/Hospitalization: No - Comment as needed  Activities of Daily Living Home Assistive Devices/Equipment: None ADL Screening (condition at time of admission) Patient's cognitive ability adequate to safely complete daily activities?: Yes Is the patient deaf or have difficulty hearing?: No Does the patient have  difficulty seeing, even when wearing glasses/contacts?: No Does the patient have difficulty concentrating, remembering, or making decisions?: No Patient able to express need for assistance with ADLs?: Yes Does the patient have difficulty dressing or bathing?: No Independently performs ADLs?: Yes (appropriate for developmental age) Does the patient have difficulty walking or climbing stairs?: No Weakness of Legs: None Weakness of Arms/Hands: None  Permission Sought/Granted                  Emotional Assessment       Orientation: : Oriented to Self, Oriented to Place, Oriented to  Time, Oriented to Situation Alcohol / Substance Use: Not Applicable Psych Involvement: No (comment)  Admission diagnosis:  Respiratory distress [R06.03] Rib pain [R07.81] Pneumonia due to COVID-19 virus [U07.1, J12.82] Patient Active Problem List   Diagnosis Date Noted  . Pneumonia due to COVID-19 virus 01/27/2020  . Sepsis (HCC) 01/27/2020  . Acute respiratory failure with hypoxia (HCC) 01/27/2020  . Fatty liver disease, nonalcoholic 01/27/2020  . Obesity (BMI 30.0-34.9) 01/27/2020  . Protein calorie malnutrition (HCC) 01/27/2020  . DNI (do not intubate) 01/27/2020  . Calculus of ureter 09/27/2017   PCP:  Paulina Fusi, MD Pharmacy:   [facility] - [place], [place] - [address] [place] [address] Phone: [phone] Fax: [phone]  [facility] [phone] - [place], [place] - [address] AT [facility] [address]  [address] [place] [address] Phone: [phone] Fax: [phone]     Social Determinants of Health (SDOH) Interventions    Readmission Risk Interventions No flowsheet data found.

## 2020-01-31 NOTE — TOC Transition Note (Signed)
Transition of Care [facility]) - CM/SW Discharge Note   Patient Details  Name: Eric Lutz MRN: [id number] Date of Birth: August 26, 1975  Transition of Care [facility]) CM/SW Contact:  Lockie Pares, RN Phone Number: 01/31/2020, 1:09 PM   Clinical Narrative:     Patient discharging has oxygen down to 2LPM for home use. Has oxygen at home voicemail left with rep with new liter flow ( [employer]). No other needs identified.   Final next level of care: Home/Self Care Barriers to Discharge: No Barriers Identified   Patient Goals and CMS Choice        Discharge Placement                       Discharge Plan and Services   Discharge Planning Services: CM Consult            DME Arranged: Oxygen DME Agency: [facility] Date DME Agency Contacted: 01/31/20 Time DME Agency Contacted: 1030 Representative spoke with at DME Agency: Morrie Sheldon stocks left message still has oxygen at home            Social Determinants of Health (SDOH) Interventions     Readmission Risk Interventions No flowsheet data found.

## 2020-02-01 LAB — CULTURE, BLOOD (ROUTINE X 2)
Culture: NO GROWTH
Culture: NO GROWTH
Special Requests: ADEQUATE
Special Requests: ADEQUATE

## 2020-02-01 LAB — LEGIONELLA PNEUMOPHILA SEROGP 1 UR AG: L. pneumophila Serogp 1 Ur Ag: NEGATIVE

## 2020-02-18 ENCOUNTER — Other Ambulatory Visit: Payer: Self-pay | Admitting: Emergency Medicine

## 2022-03-06 IMAGING — DX DG CHEST 1V PORT
1 series · 1 of 1 positions shown · non-contrast
Comparison: June 21, 2012

CLINICAL DATA: Increased work of breathing and decreased O2
saturation in the setting of COVID exposure

EXAM:
PORTABLE CHEST 1 VIEW

[chest ap]
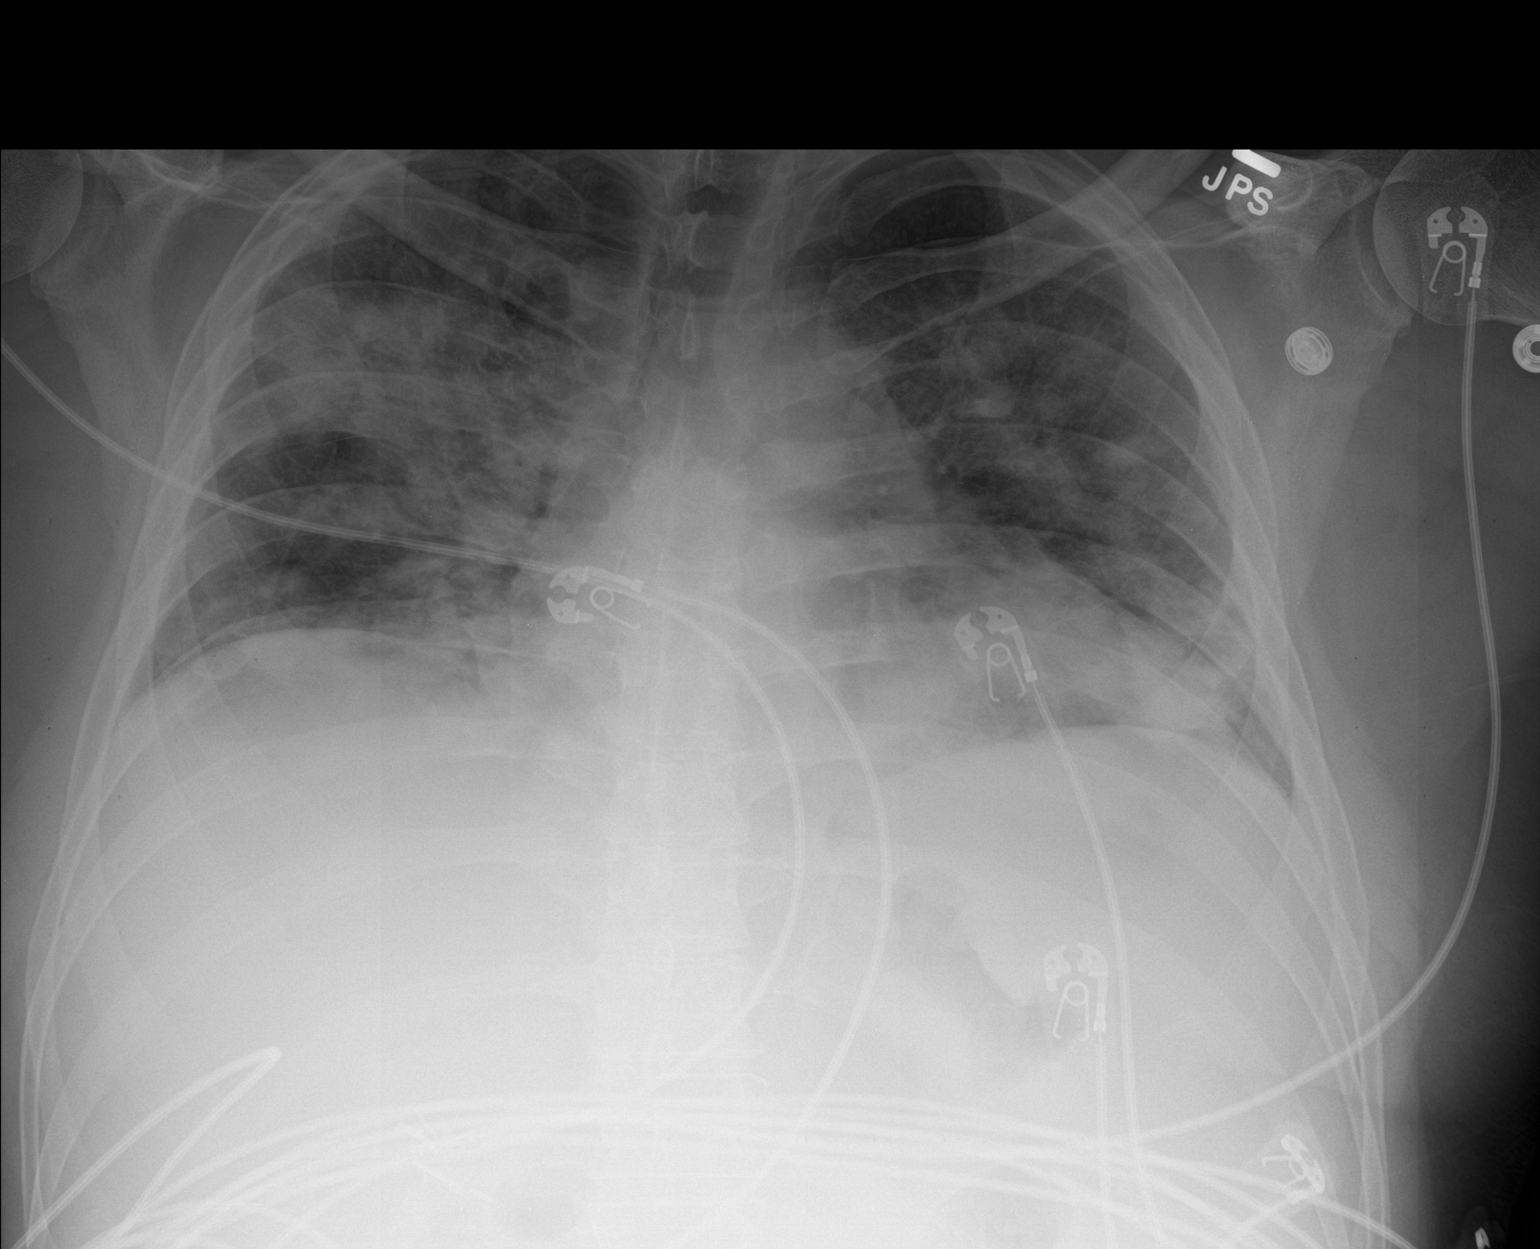

[1 of 1 positions shown; findings below may reference images not displayed]

FINDINGS: Lung volumes are low. Heart size accentuated by portable technique
and low lung volumes likely stable compared to prior study.

Hilar structures are obscured by scattered areas of confluent
airspace disease in the RIGHT mid chest and lower chest particularly
but with scattered nodular densities throughout the lung base on the
RIGHT in the mid chest on the LEFT.

No sign of pleural effusion.

On limited assessment no acute skeletal process.
IMPRESSION: Low lung volumes with scattered areas of confluent airspace disease
and scattered nodular densities as described. Findings worrisome for
multifocal pneumonia in this patient with history of exposure to
UQBVE-RS. Follow-up is suggested to ensure resolution.
